# Patient Record
Sex: Female | Born: 1965 | Race: White | Hispanic: No | Marital: Married | Smoking: Never smoker
Health system: Southern US, Community
[De-identification: ages and names within clinical notes are randomized; demographics above are authoritative.]

## PROBLEM LIST (undated history)

## (undated) DIAGNOSIS — K219 Gastro-esophageal reflux disease without esophagitis: Secondary | ICD-10-CM

## (undated) DIAGNOSIS — G43909 Migraine, unspecified, not intractable, without status migrainosus: Secondary | ICD-10-CM

## (undated) DIAGNOSIS — F419 Anxiety disorder, unspecified: Secondary | ICD-10-CM

## (undated) HISTORY — DX: Migraine, unspecified, not intractable, without status migrainosus: G43.909

## (undated) HISTORY — PX: OTHER SURGICAL HISTORY: SHX169

## (undated) HISTORY — DX: Anxiety disorder, unspecified: F41.9

## (undated) HISTORY — DX: Gastro-esophageal reflux disease without esophagitis: K21.9

---

## 2015-05-09 DIAGNOSIS — G43919 Migraine, unspecified, intractable, without status migrainosus: Secondary | ICD-10-CM | POA: Diagnosis not present

## 2015-05-09 DIAGNOSIS — F410 Panic disorder [episodic paroxysmal anxiety] without agoraphobia: Secondary | ICD-10-CM | POA: Diagnosis not present

## 2015-07-11 DIAGNOSIS — G44009 Cluster headache syndrome, unspecified, not intractable: Secondary | ICD-10-CM | POA: Diagnosis not present

## 2015-10-11 DIAGNOSIS — Z23 Encounter for immunization: Secondary | ICD-10-CM | POA: Diagnosis not present

## 2015-11-11 DIAGNOSIS — Z01419 Encounter for gynecological examination (general) (routine) without abnormal findings: Secondary | ICD-10-CM | POA: Diagnosis not present

## 2015-11-11 DIAGNOSIS — Z6825 Body mass index (BMI) 25.0-25.9, adult: Secondary | ICD-10-CM | POA: Diagnosis not present

## 2015-11-11 DIAGNOSIS — Z124 Encounter for screening for malignant neoplasm of cervix: Secondary | ICD-10-CM | POA: Diagnosis not present

## 2015-12-04 ENCOUNTER — Encounter: Payer: Self-pay | Admitting: Pediatrics

## 2015-12-04 ENCOUNTER — Ambulatory Visit (INDEPENDENT_AMBULATORY_CARE_PROVIDER_SITE_OTHER): Payer: BLUE CROSS/BLUE SHIELD | Admitting: Pediatrics

## 2015-12-04 VITALS — BP 124/83 | HR 79 | Temp 97.5°F | Ht 66.0 in | Wt 161.2 lb

## 2015-12-04 DIAGNOSIS — G43009 Migraine without aura, not intractable, without status migrainosus: Secondary | ICD-10-CM | POA: Diagnosis not present

## 2015-12-04 DIAGNOSIS — M25512 Pain in left shoulder: Secondary | ICD-10-CM

## 2015-12-04 DIAGNOSIS — Z1231 Encounter for screening mammogram for malignant neoplasm of breast: Secondary | ICD-10-CM | POA: Diagnosis not present

## 2015-12-04 DIAGNOSIS — M25511 Pain in right shoulder: Secondary | ICD-10-CM | POA: Diagnosis not present

## 2015-12-04 DIAGNOSIS — Z1239 Encounter for other screening for malignant neoplasm of breast: Secondary | ICD-10-CM

## 2015-12-04 DIAGNOSIS — Z1211 Encounter for screening for malignant neoplasm of colon: Secondary | ICD-10-CM

## 2015-12-04 NOTE — Progress Notes (Signed)
Subjective:   Patient ID: Amy Stout, female    DOB: 05-Oct-1965, 50 y.o.   MRN: [id number] CC: New Patient (Initial Visit) follow up med problems  HPI: Amy Stout is a 50 y.o. female presenting for New Patient (Initial Visit)  Headaches:  At most 1-2 times a week This month has had rarely Tylenol, BC powder doesn't help Headaches dont wake her up, does often start in the morning Usually starts piercing pain medial R eyebrow Missed test for sleep apnea Headaches last for a few hours usually Recently started on sumatriptan, does help Sometimes helps when she presses on the spot that hurts Has astigmatism R eye, had laser surgery to correct near vision both eyes, not distance vision R eye Has had headaches off and on for most of her life No light or sound sensitivity Does feel nauseous at times with headaches Sometimes says she "just needs to lie down" with headache Sumatriptan does not make her sleepy  Pap smear within last month: at [facility]  Has had shoulder pain b/l off and on Had cortisone shot two years ago, helped some Works in [employer], repetitive motion   Depression screen PHQ 2/9 12/04/2015  Decreased Interest 0  Down, Depressed, Hopeless 0  PHQ - 2 Score 0   Past Medical History:  Diagnosis Date  . Migraines    Family History  Problem Relation Age of Onset  . Arthritis Mother   . Depression Mother   . Hearing loss Mother   . Miscarriages / [place] Mother   . Alcohol abuse Father   . Cancer Father   . Diabetes Father   . Heart disease Father   . Asthma Maternal Aunt   . Asthma Maternal Uncle   . Stroke Paternal Grandfather    Social History   Social History  . Marital status: Married    Spouse name: N/A  . Number of children: N/A  . Years of education: N/A   Social History Main Topics  . Smoking status: Never Smoker  . Smokeless tobacco: Never Used  . Alcohol use No  . Drug use: No  . Sexual activity: Not Asked   Other  Topics Concern  . None   Social History Narrative  . None   ROS: All systems negative other than what is in HPI  Objective:    BP 124/83   Pulse 79   Temp 97.5 F (36.4 C) (Oral)   Ht 5\' 6"  (1.676 m)   Wt 161 lb 3.2 oz (73.1 kg)   BMI 26.02 kg/m   Wt Readings from Last 3 Encounters:  12/04/15 161 lb 3.2 oz (73.1 kg)    Gen: NAD, alert, cooperative with exam, NCAT EYES: EOMI, no conjunctival injection, or no icterus ENT:  TMs pearly gray b/l, OP without erythema LYMPH: no cervical LAD CV: NRRR, normal S1/S2, no murmur, distal pulses 2+ b/l Resp: CTABL, no wheezes, normal WOB Abd: +BS, soft, NTND. no guarding or organomegaly Ext: No edema, warm Neuro: Alert and oriented, strength equal b/l UE and LE, coordination grossly normal MSK: normal ROM b/l shoulder, rotator cuff muscles intact, normal to inspection and palpation, no tenderness over biceps insertion b/l  Assessment & Plan:  Aurella was seen today for new patient (initial visit), f/u multiple med problems.  Diagnoses and all orders for this visit:  Migraine without aura and without status migrainosus, not intractable Well controlled Cont sumatriptan prn  Colon cancer screening Does not want colonoscopy at this time,  agrees to below -     Fecal occult blood, imunochemical; Future  Breast cancer screening Due for mammogram -     MM Digital Screening; Future  Pain of both shoulder joints Comes and goes Pain improved now compared with worst pain Uses shoulders daily at work and work around house Nl ROM, NSAIDs prn, rest, ice Let me know if pain worsens  Follow up plan: Return in about 6 months (around 06/02/2016). [name], MD [place]

## 2015-12-05 ENCOUNTER — Other Ambulatory Visit: Payer: BLUE CROSS/BLUE SHIELD

## 2015-12-05 DIAGNOSIS — Z1211 Encounter for screening for malignant neoplasm of colon: Secondary | ICD-10-CM | POA: Diagnosis not present

## 2015-12-06 LAB — FECAL OCCULT BLOOD, IMMUNOCHEMICAL: Fecal Occult Bld: NEGATIVE

## 2016-05-04 ENCOUNTER — Encounter: Payer: BLUE CROSS/BLUE SHIELD | Admitting: *Deleted

## 2016-05-04 DIAGNOSIS — Z1231 Encounter for screening mammogram for malignant neoplasm of breast: Secondary | ICD-10-CM | POA: Diagnosis not present

## 2016-05-06 DIAGNOSIS — L821 Other seborrheic keratosis: Secondary | ICD-10-CM | POA: Diagnosis not present

## 2016-05-06 DIAGNOSIS — B078 Other viral warts: Secondary | ICD-10-CM | POA: Diagnosis not present

## 2016-09-23 DIAGNOSIS — Z23 Encounter for immunization: Secondary | ICD-10-CM | POA: Diagnosis not present

## 2017-03-10 ENCOUNTER — Ambulatory Visit: Payer: BLUE CROSS/BLUE SHIELD | Admitting: Pediatrics

## 2017-03-10 ENCOUNTER — Encounter: Payer: Self-pay | Admitting: Pediatrics

## 2017-03-10 VITALS — BP 122/85 | HR 81 | Temp 97.4°F | Ht 66.0 in | Wt 168.0 lb

## 2017-03-10 DIAGNOSIS — F411 Generalized anxiety disorder: Secondary | ICD-10-CM | POA: Diagnosis not present

## 2017-03-10 DIAGNOSIS — G43009 Migraine without aura, not intractable, without status migrainosus: Secondary | ICD-10-CM | POA: Diagnosis not present

## 2017-03-10 MED ORDER — SUMATRIPTAN SUCCINATE 50 MG PO TABS: 50.0000 mg | ORAL_TABLET | ORAL | 4 refills | Status: DC | PRN

## 2017-03-10 MED ORDER — VENLAFAXINE HCL 25 MG PO TABS: 25.0000 mg | ORAL_TABLET | Freq: Two times a day (BID) | ORAL | 2 refills | Status: DC

## 2017-03-10 NOTE — Progress Notes (Signed)
Subjective:   Patient ID: Amy Stout, female    DOB: 08/17/1965, 52 y.o.   MRN: [id number] CC: Medication Refill (Imitrex)  HPI: Amy Stout is a 52 y.o. female presenting for Medication Refill (Imitrex)  Migraines: sometimes gets HA in the morning, sometimes at night. Doesn't wake her up, but HA sometimes there in the morning. Sometimes has 5 HA in a month, sometimes maybe 1. Always has the same headache, hurts R eyebrow, same HA no matter what time of day. Sometimes with nausea, sometimes light and sound bother her. Sometimes tries ibuprofen/tylenol for HA, helps with more minor HA. Bad HA lays down, sleeps some, takes imitrex with improvement. Headaches havent changed.  Here today for refill of her sumatriptan.  Some months rarely needing.  Some months she uses several times.  She does think that the headaches are affecting her often enough she would be interested in prophylactic medication.  Sleeps restlessly, says she always has. Snores at times. Not falling asleep easily during the day.  Patient says she was recommended in the past for her to have a sleep study done, patient says she does not want to have done.  She feels anxious much of the time.  Especially at work, sometimes is more irritable.  Mood has been fine.  No thoughts of self-harm.  Mother had significant anxiety patient thinks, was never treated.  GAD 7 : Generalized Anxiety Score 03/10/2017  Nervous, Anxious, on Edge 3  Control/stop worrying 3  Worry too much - different things 3  Trouble relaxing 3  Restless 0  Easily annoyed or irritable 2  Afraid - awful might happen 3  Total GAD 7 Score 17  Anxiety Difficulty Somewhat difficult    Relevant past medical, surgical, family and social history reviewed. Allergies and medications reviewed and updated. Social History   Tobacco Use  Smoking Status Never Smoker  Smokeless Tobacco Never Used   ROS: Per HPI   Objective:    BP 122/85   Pulse 81   Temp (!) 97.4  F (36.3 C) (Oral)   Ht 5\' 6"  (1.676 m)   Wt 168 lb (76.2 kg)   BMI 27.12 kg/m   Wt Readings from Last 3 Encounters:  03/10/17 168 lb (76.2 kg)  12/04/15 161 lb 3.2 oz (73.1 kg)    Gen: NAD, alert, cooperative with exam, NCAT EYES: EOMI, no conjunctival injection, or no icterus CV: NRRR, normal S1/S2, no murmur, distal pulses 2+ b/l Resp: CTABL, no wheezes, normal WOB Abd: +BS, soft, NTND.  Ext: No edema, warm Neuro: Alert and oriented, strength equal b/l UE and LE, coordination grossly normal MSK: normal muscle bulk Psych: normal affect, no thoughts of self-harm  Assessment & Plan:  [name] was seen today for medication refill.  Diagnoses and all orders for this visit:  Migraine without aura and without status migrainosus, not intractable Will start venlafaxine as prophylactic medicine, also may help treat anxiety.  Take half tab twice a day for several days, then take full tab twice a day. -     SUMAtriptan (IMITREX) 50 MG tablet; Take 1 tablet (50 mg total) by mouth every 2 (two) hours as needed for migraine. May repeat in 2 hours if headache persists or recurs. -     venlafaxine (EFFEXOR) 25 MG tablet; Take 1 tablet (25 mg total) by mouth 2 (two) times daily.  GAD (generalized anxiety disorder) Ongoing symptoms.  Will start below. -     venlafaxine (EFFEXOR) 25 MG tablet;  Take 1 tablet (25 mg total) by mouth 2 (two) times daily.   Follow up plan: Return in about 6 weeks (around 04/21/2017). [name], MD [place]

## 2017-03-20 ENCOUNTER — Telehealth: Payer: Self-pay | Admitting: Pediatrics

## 2017-03-31 NOTE — Telephone Encounter (Signed)
Attempted to contact patient - NVM. Patient has not returned calls. This encounter will be closed.

## 2017-04-21 ENCOUNTER — Ambulatory Visit: Payer: BLUE CROSS/BLUE SHIELD | Admitting: Pediatrics

## 2017-04-21 ENCOUNTER — Encounter: Payer: Self-pay | Admitting: Pediatrics

## 2017-04-21 VITALS — BP 130/84 | HR 76 | Temp 98.1°F | Ht 66.0 in | Wt 168.2 lb

## 2017-04-21 DIAGNOSIS — G43809 Other migraine, not intractable, without status migrainosus: Secondary | ICD-10-CM

## 2017-04-21 DIAGNOSIS — G43009 Migraine without aura, not intractable, without status migrainosus: Secondary | ICD-10-CM

## 2017-04-21 DIAGNOSIS — J3089 Other allergic rhinitis: Secondary | ICD-10-CM | POA: Diagnosis not present

## 2017-04-21 MED ORDER — AMITRIPTYLINE HCL 10 MG PO TABS: 10.0000 mg | ORAL_TABLET | Freq: Every day | ORAL | 3 refills | Status: DC

## 2017-04-21 MED ORDER — SUMATRIPTAN SUCCINATE 50 MG PO TABS: 50.0000 mg | ORAL_TABLET | ORAL | 4 refills | Status: DC | PRN

## 2017-04-21 MED ORDER — FLUTICASONE PROPIONATE 50 MCG/ACT NA SUSP: 2.0000 | Freq: Every day | NASAL | 6 refills | Status: DC

## 2017-04-21 NOTE — Progress Notes (Signed)
  Subjective:   Patient ID: Amy Stout, female    DOB: 29-Sep-1965, 52 y.o.   MRN: [id number] CC: Follow-up (6 week recheck, migraines and anxiety)  HPI: [name] is a 52 y.o. female presenting for Follow-up (6 week recheck, migraines and anxiety)  Had daily headaches until about two weeks ago. Now happening as much since then. Last few days hasnt had much problem with her headaches.   Was started on venlafaxine last visit, had nausea, started feeling fidgety. Took for about three days, half a tab, before symptoms became too severe to continue.  Has a full refill of sumatriptan still at home.  Has been sneezing daily, taking claritin-D for the past week, helping some.  Ongoing congestion most of the time.  For the last few weeks.  Relevant past medical, surgical, family and social history reviewed. Allergies and medications reviewed and updated. Social History   Tobacco Use  Smoking Status Never Smoker  Smokeless Tobacco Never Used   ROS: Per HPI   Objective:    BP 130/84   Pulse 76   Temp 98.1 F (36.7 C) (Oral)   Ht 5\' 6"  (1.676 m)   Wt 168 lb 3.2 oz (76.3 kg)   BMI 27.15 kg/m   Wt Readings from Last 3 Encounters:  04/21/17 168 lb 3.2 oz (76.3 kg)  03/10/17 168 lb (76.2 kg)  12/04/15 161 lb 3.2 oz (73.1 kg)    Gen: NAD, alert, cooperative with exam, NCAT EYES: EOMI, no conjunctival injection, or no icterus ENT:  TMs pearly gray b/l, OP without erythema CV: NRRR, normal S1/S2, no murmur, distal pulses 2+ b/l Resp: CTABL, no wheezes, normal WOB Abd: +BS, soft, NTND. no guarding or organomegaly Ext: No edema, warm Neuro: Alert and oriented, strength equal b/l UE and LE, coordination grossly normal MSK: normal muscle bulk  Assessment & Plan:  Adanna was seen today for follow-up migraines.  Diagnoses and all orders for this visit:  Other migraine without status migrainosus, not intractable Ongoing symptoms.  Continue sumatriptan as needed.  No more than  twice a week.  Will do trial below for prophylaxis. -     amitriptyline (ELAVIL) 10 MG tablet; Take 1 tablet (10 mg total) by mouth at bedtime.  Allergic rhinitis due to other allergic trigger, unspecified seasonality Discussed sinus rinses, continue antihistamine. -     fluticasone (FLONASE) 50 MCG/ACT nasal spray; Place 2 sprays into both nostrils daily.  Migraine without aura and without status migrainosus, not intractable -     SUMAtriptan (IMITREX) 50 MG tablet; Take 1 tablet (50 mg total) by mouth every 2 (two) hours as needed for migraine. May repeat in 2 hours if headache persists or recurs.   Follow up plan: Return in about 3 months (around 07/21/2017). [name], MD [place]

## 2017-06-30 DIAGNOSIS — Z30432 Encounter for removal of intrauterine contraceptive device: Secondary | ICD-10-CM | POA: Diagnosis not present

## 2017-07-23 ENCOUNTER — Ambulatory Visit: Payer: BLUE CROSS/BLUE SHIELD | Admitting: Pediatrics

## 2017-07-23 ENCOUNTER — Encounter: Payer: Self-pay | Admitting: Pediatrics

## 2017-07-23 VITALS — BP 118/81 | HR 85 | Temp 98.2°F | Ht 66.0 in | Wt 173.0 lb

## 2017-07-23 DIAGNOSIS — R0683 Snoring: Secondary | ICD-10-CM | POA: Diagnosis not present

## 2017-07-23 DIAGNOSIS — Z6827 Body mass index (BMI) 27.0-27.9, adult: Secondary | ICD-10-CM

## 2017-07-23 DIAGNOSIS — Z1211 Encounter for screening for malignant neoplasm of colon: Secondary | ICD-10-CM

## 2017-07-23 DIAGNOSIS — Z13220 Encounter for screening for lipoid disorders: Secondary | ICD-10-CM | POA: Diagnosis not present

## 2017-07-23 DIAGNOSIS — G43009 Migraine without aura, not intractable, without status migrainosus: Secondary | ICD-10-CM | POA: Diagnosis not present

## 2017-07-23 LAB — LIPID PANEL
Chol/HDL Ratio: 3.1 ratio (ref 0.0–4.4)
Cholesterol, Total: 191 mg/dL (ref 100–199)
HDL: 61 mg/dL (ref >39)
LDL Calculated: 112 mg/dL — ABNORMAL HIGH (ref 0–99)
Triglycerides: 89 mg/dL (ref 0–149)
VLDL Cholesterol Cal: 18 mg/dL (ref 5–40)

## 2017-07-23 LAB — BMP8+EGFR
BUN/Creatinine Ratio: 10 (ref 9–23)
BUN: 9 mg/dL (ref 6–24)
CO2: 27 mmol/L (ref 20–29)
Calcium: 9.7 mg/dL (ref 8.7–10.2)
Chloride: 101 mmol/L (ref 96–106)
Creatinine, Ser: 0.89 mg/dL (ref 0.57–1.00)
GFR calc Af Amer: 86 mL/min/{1.73_m2} (ref >59)
GFR calc non Af Amer: 75 mL/min/{1.73_m2} (ref >59)
Glucose: 101 mg/dL — ABNORMAL HIGH (ref 65–99)
Potassium: 3.8 mmol/L (ref 3.5–5.2)
Sodium: 141 mmol/L (ref 134–144)

## 2017-07-23 MED ORDER — SUMATRIPTAN SUCCINATE 50 MG PO TABS: 50.0000 mg | ORAL_TABLET | ORAL | 4 refills | Status: DC | PRN

## 2017-07-23 MED ORDER — AMITRIPTYLINE HCL 10 MG PO TABS: 10.0000 mg | ORAL_TABLET | Freq: Every day | ORAL | 3 refills | Status: DC

## 2017-07-23 NOTE — Progress Notes (Signed)
  Subjective:   Patient ID: Amy Stout, female    DOB: Feb 24, 1965, 52 y.o.   MRN: [id number] CC: Medical Management of Chronic Issues  HPI: Amy Stout is a 52 y.o. female   Migraine: Ongoing symptoms.  Frequency of headaches varies.  She thinks overall improved since starting amitriptyline.  She is rarely having to use the sumatriptan.  Snoring loudly, feeling tired during the day. Sometimes headaches in the morning. Headaches improved since starting amitriptyline. Does not want sleep study, does not want to have to wear CPAP.   Relevant past medical, surgical, family and social history reviewed. Allergies and medications reviewed and updated. Social History   Tobacco Use  Smoking Status Never Smoker  Smokeless Tobacco Never Used   ROS: Per HPI   Objective:    BP 118/81   Pulse 85   Temp 98.2 F (36.8 C) (Oral)   Ht _0  (1.676 m)   Wt 173 lb (78.5 kg)   BMI 27.92 kg/m   Wt Readings from Last 3 Encounters:  07/23/17 173 lb (78.5 kg)  04/21/17 168 lb 3.2 oz (76.3 kg)  03/10/17 168 lb (76.2 kg)    Gen: NAD, alert, cooperative with exam, NCAT EYES: EOMI, no conjunctival injection, or no icterus ENT:  TMs pearly gray b/l, OP without erythema LYMPH: no cervical LAD CV: NRRR, normal S1/S2, no murmur, distal pulses 2+ b/l Resp: CTABL, no wheezes, normal WOB Abd: +BS, soft, NTND. no guarding or organomegaly Ext: No edema, warm Neuro: Alert and oriented, strength equal b/l UE and LE, coordination grossly normal MSK: normal muscle bulk  Assessment & Plan:  Amy Stout was seen today for medical management of chronic issues.  Diagnoses and all orders for this visit:  Migraine without aura and without status migrainosus, not intractable Improved symptoms, continue below.  -     amitriptyline (ELAVIL) 10 MG tablet; Take 1 tablet (10 mg total) by mouth at bedtime. -     SUMAtriptan (IMITREX) 50 MG tablet; Take 1 tablet (50 mg total) by mouth every 2 (two) hours as needed for  migraine. May repeat in 2 hours if headache persists or recurs. -     BMP8+EGFR  Screening for hyperlipidemia -     Lipid panel  Screening for colon cancer -     Fecal occult blood, imunochemical; Future  Snoring Declined sleep study, will continue to follow symptoms, let me know if any worsening. Discussed long term risks of untreated sleep apnea.  BMI 27.0-27.9,adult Continue lifestyle changes, increasing fruit/vegetables  Follow up plan: Return in about 6 months (around 01/23/2018). [name], MD [place]

## 2017-08-05 ENCOUNTER — Telehealth: Payer: Self-pay | Admitting: Pediatrics

## 2017-08-05 NOTE — Telephone Encounter (Signed)
Pt aware of results 

## 2017-09-28 DIAGNOSIS — Z23 Encounter for immunization: Secondary | ICD-10-CM | POA: Diagnosis not present

## 2017-10-06 ENCOUNTER — Encounter: Payer: Self-pay | Admitting: Pediatrics

## 2017-10-06 ENCOUNTER — Ambulatory Visit: Payer: BLUE CROSS/BLUE SHIELD | Admitting: Pediatrics

## 2017-10-06 VITALS — BP 132/82 | HR 83 | Temp 98.1°F | Ht 66.0 in | Wt 173.2 lb

## 2017-10-06 DIAGNOSIS — K219 Gastro-esophageal reflux disease without esophagitis: Secondary | ICD-10-CM

## 2017-10-06 DIAGNOSIS — G43009 Migraine without aura, not intractable, without status migrainosus: Secondary | ICD-10-CM

## 2017-10-06 DIAGNOSIS — M25531 Pain in right wrist: Secondary | ICD-10-CM

## 2017-10-06 MED ORDER — AMITRIPTYLINE HCL 25 MG PO TABS: 25.0000 mg | ORAL_TABLET | Freq: Every day | ORAL | 2 refills | Status: DC

## 2017-10-06 MED ORDER — ESOMEPRAZOLE MAGNESIUM 40 MG PO CPDR: 40.0000 mg | DELAYED_RELEASE_CAPSULE | Freq: Every day | ORAL | 1 refills | Status: DC

## 2017-10-06 NOTE — Progress Notes (Signed)
Subjective:   Patient ID: Amy Stout, female    DOB: [birth year], 52 y.o.   MRN: [id number] CC: Medical Management of Chronic Issues and Gastroesophageal Reflux  HPI: ANNALEIGH Stout is a 51 y.o. female   Migraines: improved with amitriptyline, this months she thinks she has had 10 which is more than usual she thinks. Not sure why this month has been worse. Works in [employer], noisy, wears ear plugs, smells of chemicals. Taking imitrex rarely, < 2x/week. Refilled once in 3 mo and has only taken one tab of refill.  GERD: Takes over-the-counter Nexium 20 mg most days.  She skips it more than a couple days in a row she notices more symptoms.  She is not sure that certain foods seem to have an effect compared to others.  Most recently she noticed symptoms after eating pizza and a salad for lunch.  She stays fairly active, work she is lifting and moving regularly.  She never has chest pressure or chest pain with exertion.  Right wrist radial side has been bothering her over the last 2 weeks.  She notices it mostly with working.  She is been wearing a splint while working.  No injury that she knows of.  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  She does notice the pain more after regularly lifting.  Relevant past medical, surgical, family and social history reviewed. Allergies and medications reviewed and updated. Social History   Tobacco Use  Smoking Status Never Smoker  Smokeless Tobacco Never Used   ROS: Per HPI   Objective:    BP 132/82   Pulse 83   Temp 98.1 F (36.7 C) (Oral)   Ht 5\' 6"  (1.676 m)   Wt 173 lb 3.2 oz (78.6 kg)   BMI 27.96 kg/m   Wt Readings from Last 3 Encounters:  10/06/17 173 lb 3.2 oz (78.6 kg)  07/23/17 173 lb (78.5 kg)  04/21/17 168 lb 3.2 oz (76.3 kg)    Gen: NAD, alert, cooperative with exam, NCAT EYES: EOMI, no conjunctival injection, or no icterus ENT:  OP without erythema LYMPH: no cervical LAD CV: NRRR,  normal S1/S2, no murmur, distal pulses 2+ b/l Resp: CTABL, no wheezes, normal WOB Abd: +BS, soft, NTND. no guarding or organomegaly Ext: No edema, warm Neuro: Alert and oriented, strength equal b/l UE and LE, coordination grossly normal MSK: normal muscle bulk  Assessment & Plan:  Moani was seen today for medical management of chronic issues and gastroesophageal reflux.  Diagnoses and all orders for this visit:  Gastroesophageal reflux disease, esophagitis presence not specified Try increased dose of below for 2 mo, then consider de-escalating if able. Discussed use of prns, avoiding exacerbating foods. -     esomeprazole (NEXIUM) 40 MG capsule; Take 1 capsule (40 mg total) by mouth daily at 12 noon.  Migraine without aura and without status migrainosus, not intractable Somewhat improved. Work still a trigger at times, migraines also keep her from completing work at times. FMLA filled out today, may need to be out for 0-2 days a week 0-2 weeks a month over next 12 mo for chronic med problem, migraines. Working on improving control with below, start increased dose. Also discussed decreasing caffeine intake, drinks large amounts daily now. -     amitriptyline (ELAVIL) 25 MG tablet; Take 1 tablet (25 mg total) by mouth at bedtime.  Right wrist pain No known injury, worse with repetitive movements with wrist.  Cont wrist splint as needed, especially at work. If not improving over next 1-2 weeks will get wrist xray, consider referral to ortho vs PT. Any worsening let me know.  Follow up plan: Return in about 3 months (around 01/06/2018). [name], MD [place]

## 2017-10-06 NOTE — Patient Instructions (Signed)

## 2017-11-23 ENCOUNTER — Telehealth: Payer: Self-pay | Admitting: Pediatrics

## 2017-11-23 DIAGNOSIS — M25531 Pain in right wrist: Secondary | ICD-10-CM

## 2017-11-23 NOTE — Telephone Encounter (Signed)
Please advise if patient came come in to just get xray or needs to be seen again?

## 2017-11-23 NOTE — Telephone Encounter (Signed)
Patient aware and verbalizes understanding. 

## 2017-11-24 ENCOUNTER — Ambulatory Visit (INDEPENDENT_AMBULATORY_CARE_PROVIDER_SITE_OTHER): Payer: BLUE CROSS/BLUE SHIELD

## 2017-11-24 DIAGNOSIS — M25531 Pain in right wrist: Secondary | ICD-10-CM

## 2018-01-24 ENCOUNTER — Ambulatory Visit: Payer: BLUE CROSS/BLUE SHIELD | Admitting: Pediatrics

## 2018-01-28 ENCOUNTER — Ambulatory Visit: Payer: Self-pay | Admitting: Family Medicine

## 2018-01-28 ENCOUNTER — Ambulatory Visit: Payer: BLUE CROSS/BLUE SHIELD | Admitting: Pediatrics

## 2018-08-01 ENCOUNTER — Other Ambulatory Visit: Payer: Self-pay

## 2018-08-02 ENCOUNTER — Telehealth: Payer: Self-pay | Admitting: Family

## 2018-08-02 ENCOUNTER — Encounter: Payer: Self-pay | Admitting: Family

## 2018-08-02 ENCOUNTER — Ambulatory Visit: Payer: BC Managed Care – PPO | Admitting: Family

## 2018-08-02 VITALS — BP 135/89 | HR 89 | Temp 98.6°F | Ht 66.0 in | Wt 178.6 lb

## 2018-08-02 DIAGNOSIS — J3089 Other allergic rhinitis: Secondary | ICD-10-CM

## 2018-08-02 DIAGNOSIS — K219 Gastro-esophageal reflux disease without esophagitis: Secondary | ICD-10-CM | POA: Diagnosis not present

## 2018-08-02 DIAGNOSIS — G43009 Migraine without aura, not intractable, without status migrainosus: Secondary | ICD-10-CM

## 2018-08-02 DIAGNOSIS — E049 Nontoxic goiter, unspecified: Secondary | ICD-10-CM

## 2018-08-02 DIAGNOSIS — E669 Obesity, unspecified: Secondary | ICD-10-CM | POA: Insufficient documentation

## 2018-08-02 DIAGNOSIS — F411 Generalized anxiety disorder: Secondary | ICD-10-CM

## 2018-08-02 DIAGNOSIS — K2100 Gastro-esophageal reflux disease with esophagitis, without bleeding: Secondary | ICD-10-CM

## 2018-08-02 DIAGNOSIS — E663 Overweight: Secondary | ICD-10-CM

## 2018-08-02 MED ORDER — SUMATRIPTAN SUCCINATE 50 MG PO TABS: 50.0000 mg | ORAL_TABLET | ORAL | 4 refills | Status: DC | PRN

## 2018-08-02 MED ORDER — FLUTICASONE PROPIONATE 50 MCG/ACT NA SUSP: 2.0000 | Freq: Every day | NASAL | 6 refills | Status: DC

## 2018-08-02 MED ORDER — AMITRIPTYLINE HCL 25 MG PO TABS: 25.0000 mg | ORAL_TABLET | Freq: Every day | ORAL | 2 refills | Status: DC

## 2018-08-02 MED ORDER — ESOMEPRAZOLE MAGNESIUM 40 MG PO CPDR: 40.0000 mg | DELAYED_RELEASE_CAPSULE | Freq: Every day | ORAL | 2 refills | Status: DC

## 2018-08-02 NOTE — Patient Instructions (Addendum)
Goiter  A goiter is an enlarged thyroid gland. The thyroid is located in the lower front of the neck. It makes hormones that affect many body parts and systems, including the system that affects how quickly the body burns fuel for energy (metabolism). Most goiters are painless and are not a cause for concern. Some goiters can affect the way your thyroid makes thyroid hormones. Goiters and conditions that cause goiters can be treated, if necessary. What are the causes? Common causes of this condition include:  Lack (deficiency) of a mineral called iodine. The thyroid gland uses iodine to make thyroid hormones.  Diseases that attack healthy cells in the body (autoimmune diseases) and affect thyroid function, such as Graves' disease or Hashimoto's disease. These diseases may cause the body to produce too much thyroid hormone (hyperthyroidism) or too little of the hormone (hypothyroidism).  Conditions that cause inflammation of the thyroid (thyroiditis).  One or more small growths on the thyroid (nodular goiter). Other causes include:  Medical problems caused by abnormal genes that are passed from parent to child (genetic defects).  Thyroid injury or infection.  Tumors that may or may not be cancerous.  Pregnancy.  Certain medicines.  Exposure to radiation. In some cases, the cause may not be known. What increases the risk? This condition is more likely to develop in:  People who do not get enough iodine in their diet.  People who have a family history of goiter.  Women.  People who are older than age 40.  People who smoke tobacco.  People who have had exposure to radiation. What are the signs or symptoms? The main symptom of this condition is swelling in the lower, front part of the neck. This swelling can range from a very small bump to a large lump. Other symptoms may include:  A tight feeling in the throat.  A hoarse voice.  Coughing.  Wheezing.  Difficulty  swallowing or breathing.  Bulging veins in the neck.  Dizziness. When a goiter is the result of an overactive thyroid (hyperthyroidism), symptoms may also include:  Nervousness or restlessness.  Inability to tolerate heat.  Unexplained weight loss.  Diarrhea.  Change in the texture of hair or skin.  Changes in heartbeat, such as skipped beats, extra beats, or a rapid heart rate.  Loss of menstruation.  Shaky hands.  Increased appetite.  Sleep problems. When a goiter is the result of an underactive thyroid (hypothyroidism), symptoms may also include:  Feeling like you have no energy (lethargy).  Inability to tolerate cold.  Weight gain that is not explained by a change in diet or exercise habits.  Dry skin.  Coarse hair.  Irregular menstrual periods.  Constipation.  Sadness or depression.  Fatigue. In some cases, there may not be any symptoms and the thyroid hormone levels may be normal. How is this diagnosed? This condition may be diagnosed based on your symptoms, your medical history, and a physical exam. You may have tests, such as:  Blood tests to check thyroid function.  Imaging tests, such as: ? Ultrasound. ? CT scan. ? MRI. ? Thyroid scan.  Removal of a tissue sample (biopsy) of the goiter or any nodules. The sample will be tested to check for cancer. How is this treated? Treatment for this condition depends on the cause and your symptoms. Treatment may include:  Medicines to regulate thyroid hormone levels.  Anti-inflammatory medicines or steroid medicines, if the goiter is caused by inflammation.  Iodine supplements or changes to your   diet, if the goiter is caused by iodine deficiency.  Radioactive iodine treatment.  Surgery to remove your thyroid. In some cases, you may only need regular check-ups with your health care provider to monitor your condition, and you may not need treatment. Follow these instructions at home:  Follow  instructions from your health care provider about any changes to your diet.  Take over-the-counter and prescription medicines only as told by your health care provider. These include supplements.  Do not use any products that contain nicotine or tobacco, such as cigarettes and e-cigarettes. If you need help quitting, ask your health care provider.  Keep all follow-up visits as told by your health care provider. This is important. Contact a health care provider if:  Your symptoms do not get better with treatment.  You have nausea, vomiting, or diarrhea. Get help right away if:  You have sudden, unexplained confusion or other mental changes.  You have a fever.  You have chest pain.  You have trouble breathing or swallowing.  You suddenly become very weak.  You experience extreme restlessness.  You feel your heart racing. Summary  A goiter is an enlarged thyroid gland.  The thyroid gland is located in the lower front of the neck. It makes hormones that affect many body parts and systems, including the system that affects how quickly the body burns fuel for energy (metabolism).  The main symptom of this condition is swelling in the lower, front part of the neck. This swelling can range from a very small bump to a large lump.  Treatment for this condition depends on the cause and your symptoms. You may need medicines, supplements, or regular monitoring of your condition. This information is not intended to replace advice given to you by your health care provider. Make sure you discuss any questions you have with your health care provider. Document Released: 06/11/2009 Document Revised: 12/04/2016 Document Reviewed: 09/17/2016 Elsevier Patient Education  2020 [employer].

## 2018-08-02 NOTE — Progress Notes (Signed)
Subjective:    Patient ID: Amy Stout, female    DOB: April 25, 1965, 53 y.o.   MRN: [id number]  Chief Complaint  Patient presents with  . Medical Management of Chronic Issues   Pt presents to the office today for chronic follow up.  Migraine  This is a chronic problem. The current episode started more than 1 year ago. The problem occurs monthly. The problem has been waxing and waning. The pain is located in the right unilateral region. The pain quality is similar to prior headaches. The quality of the pain is described as aching. The pain is moderate. Associated symptoms include nausea, phonophobia and photophobia. She has tried triptans for the symptoms. The treatment provided moderate relief.  Gastroesophageal Reflux She complains of belching, heartburn and nausea. This is a chronic problem. The current episode started more than 1 year ago. The problem occurs occasionally. The problem has been waxing and waning. She has tried a PPI for the symptoms. The treatment provided moderate relief.      Review of Systems  Eyes: Positive for photophobia.  Gastrointestinal: Positive for heartburn and nausea.  All other systems reviewed and are negative.      Objective:   Physical Exam Vitals signs reviewed.  Constitutional:      General: She is not in acute distress.    Appearance: She is well-developed.  HENT:     Head: Normocephalic and atraumatic.     Comments: Goiter     Right Ear: Tympanic membrane normal.     Left Ear: Tympanic membrane normal.  Eyes:     Pupils: Pupils are equal, round, and reactive to light.  Neck:     Musculoskeletal: Normal range of motion and neck supple.     Thyroid: No thyromegaly.  Cardiovascular:     Rate and Rhythm: Normal rate and regular rhythm.     Heart sounds: Normal heart sounds. No murmur.  Pulmonary:     Effort: Pulmonary effort is normal. No respiratory distress.     Breath sounds: Normal breath sounds. No wheezing.  Abdominal:   General: Bowel sounds are normal. There is no distension.     Palpations: Abdomen is soft.     Tenderness: There is no abdominal tenderness.  Musculoskeletal: Normal range of motion.        General: No tenderness.  Skin:    General: Skin is warm and dry.  Neurological:     Mental Status: She is alert and oriented to person, place, and time.     Cranial Nerves: No cranial nerve deficit.     Deep Tendon Reflexes: Reflexes are normal and symmetric.  Psychiatric:        Behavior: Behavior normal.        Thought Content: Thought content normal.        Judgment: Judgment normal.       BP (!) 142/103   Pulse 94   Temp 98.6 F (37 C) (Oral)   Ht '5\' 6"'  (1.676 m)   Wt 178 lb 9.6 oz (81 kg)   BMI 28.83 kg/m      Assessment & Plan:  Amy Stout comes in today with chief complaint of Medical Management of Chronic Issues   Diagnosis and orders addressed:  1. Migraine without aura and without status migrainosus, not intractable - amitriptyline (ELAVIL) 25 MG tablet; Take 1 tablet (25 mg total) by mouth at bedtime.  Dispense: 90 tablet; Refill: 2 - SUMAtriptan (IMITREX) 50 MG tablet; Take 1  tablet (50 mg total) by mouth every 2 (two) hours as needed for migraine. May repeat in 2 hours if headache persists or recurs.  Dispense: 10 tablet; Refill: 4 - CMP14+EGFR - CBC with Differential/Platelet  2. Gastroesophageal reflux disease, esophagitis presence not specified - esomeprazole (NEXIUM) 40 MG capsule; Take 1 capsule (40 mg total) by mouth daily at 12 noon.  Dispense: 90 capsule; Refill: 2 - CMP14+EGFR - CBC with Differential/Platelet  3. Allergic rhinitis due to other allergic trigger, unspecified seasonality - fluticasone (FLONASE) 50 MCG/ACT nasal spray; Place 2 sprays into both nostrils daily.  Dispense: 16 g; Refill: 6 - CMP14+EGFR - CBC with Differential/Platelet  4. Gastroesophageal reflux disease with esophagitis - CMP14+EGFR - CBC with Differential/Platelet  5.  Overweight (BMI 25.0-29.9) - CMP14+EGFR - CBC with Differential/Platelet  6. Goiter - CMP14+EGFR - CBC with Differential/Platelet - TSH - US Soft Tissue Head/Neck; Future  7. GAD (generalized anxiety disorder)   Labs pending Health Maintenance reviewed- Pt losing insurance 08/19/18, will try to get mammogram scheduled  Diet and exercise encouraged  Follow up plan: 6 months    Evelina Dun, FNP

## 2018-08-03 LAB — CBC WITH DIFFERENTIAL/PLATELET
Basophils Absolute: 0 10*3/uL (ref 0.0–0.2)
Basos: 1 %
EOS (ABSOLUTE): 0 10*3/uL (ref 0.0–0.4)
Eos: 1 %
Hematocrit: 42.1 % (ref 34.0–46.6)
Hemoglobin: 14.5 g/dL (ref 11.1–15.9)
Immature Grans (Abs): 0 10*3/uL (ref 0.0–0.1)
Immature Granulocytes: 0 %
Lymphocytes Absolute: 1.8 10*3/uL (ref 0.7–3.1)
Lymphs: 28 %
MCH: 29.5 pg (ref 26.6–33.0)
MCHC: 34.4 g/dL (ref 31.5–35.7)
MCV: 86 fL (ref 79–97)
Monocytes Absolute: 0.4 10*3/uL (ref 0.1–0.9)
Monocytes: 7 %
Neutrophils Absolute: 4 10*3/uL (ref 1.4–7.0)
Neutrophils: 63 %
Platelets: 242 10*3/uL (ref 150–450)
RBC: 4.91 x10E6/uL (ref 3.77–5.28)
RDW: 12.9 % (ref 11.7–15.4)
WBC: 6.3 10*3/uL (ref 3.4–10.8)

## 2018-08-03 LAB — CMP14+EGFR
ALT: 33 IU/L — ABNORMAL HIGH (ref 0–32)
AST: 16 IU/L (ref 0–40)
Albumin/Globulin Ratio: 1.6 (ref 1.2–2.2)
Albumin: 4.9 g/dL (ref 3.8–4.9)
Alkaline Phosphatase: 110 IU/L (ref 39–117)
BUN/Creatinine Ratio: 14 (ref 9–23)
BUN: 10 mg/dL (ref 6–24)
Bilirubin Total: 0.3 mg/dL (ref 0.0–1.2)
CO2: 26 mmol/L (ref 20–29)
Calcium: 10.1 mg/dL (ref 8.7–10.2)
Chloride: 98 mmol/L (ref 96–106)
Creatinine, Ser: 0.73 mg/dL (ref 0.57–1.00)
GFR calc Af Amer: 109 mL/min/{1.73_m2} (ref >59)
GFR calc non Af Amer: 94 mL/min/{1.73_m2} (ref >59)
Globulin, Total: 3 g/dL (ref 1.5–4.5)
Glucose: 85 mg/dL (ref 65–99)
Potassium: 4.4 mmol/L (ref 3.5–5.2)
Sodium: 141 mmol/L (ref 134–144)
Total Protein: 7.9 g/dL (ref 6.0–8.5)

## 2018-08-03 LAB — TSH: TSH: 1.81 u[IU]/mL (ref 0.450–4.500)

## 2018-08-03 NOTE — Telephone Encounter (Signed)
Did you all try and call patient? Please advise

## 2018-08-03 NOTE — Telephone Encounter (Signed)
I have not called her, it could have been about lab work or scheduling her [place]

## 2018-08-04 NOTE — Telephone Encounter (Signed)
Patient aware.

## 2018-08-04 NOTE — Telephone Encounter (Signed)
Left message for patient to call back.  Patient has an [place] appointment tomorrow 7/31 at 10:30 [facility].  Patient will need to arrive 15 minutes early.

## 2018-08-05 ENCOUNTER — Other Ambulatory Visit: Payer: Self-pay

## 2018-08-05 ENCOUNTER — Ambulatory Visit (HOSPITAL_COMMUNITY)
Admission: RE | Admit: 2018-08-05 | Discharge: 2018-08-05 | Disposition: A | Discharge status: Home / Self Care | Payer: BC Managed Care – PPO | Source: Ambulatory Visit | Attending: Family | Admitting: Family

## 2018-08-05 DIAGNOSIS — E049 Nontoxic goiter, unspecified: Secondary | ICD-10-CM | POA: Diagnosis not present

## 2018-08-16 DIAGNOSIS — Z139 Encounter for screening, unspecified: Secondary | ICD-10-CM | POA: Diagnosis not present

## 2018-08-16 DIAGNOSIS — Z1231 Encounter for screening mammogram for malignant neoplasm of breast: Secondary | ICD-10-CM | POA: Diagnosis not present

## 2019-05-03 ENCOUNTER — Other Ambulatory Visit: Payer: Self-pay | Admitting: Family

## 2019-05-03 DIAGNOSIS — K219 Gastro-esophageal reflux disease without esophagitis: Secondary | ICD-10-CM

## 2019-05-21 DIAGNOSIS — R430 Anosmia: Secondary | ICD-10-CM | POA: Diagnosis not present

## 2019-05-21 DIAGNOSIS — Z20822 Contact with and (suspected) exposure to covid-19: Secondary | ICD-10-CM | POA: Diagnosis not present

## 2019-05-21 DIAGNOSIS — U071 COVID-19: Secondary | ICD-10-CM | POA: Diagnosis not present

## 2019-11-21 ENCOUNTER — Ambulatory Visit (INDEPENDENT_AMBULATORY_CARE_PROVIDER_SITE_OTHER): Payer: BC Managed Care – PPO | Admitting: Family

## 2019-11-21 ENCOUNTER — Encounter: Payer: Self-pay | Admitting: Family

## 2019-11-21 ENCOUNTER — Other Ambulatory Visit: Payer: Self-pay

## 2019-11-21 VITALS — BP 134/88 | HR 79 | Temp 98.1°F | Ht 66.0 in | Wt 179.0 lb

## 2019-11-21 DIAGNOSIS — Z114 Encounter for screening for human immunodeficiency virus [HIV]: Secondary | ICD-10-CM

## 2019-11-21 DIAGNOSIS — F411 Generalized anxiety disorder: Secondary | ICD-10-CM

## 2019-11-21 DIAGNOSIS — G43009 Migraine without aura, not intractable, without status migrainosus: Secondary | ICD-10-CM | POA: Diagnosis not present

## 2019-11-21 DIAGNOSIS — Z0001 Encounter for general adult medical examination with abnormal findings: Secondary | ICD-10-CM

## 2019-11-21 DIAGNOSIS — E663 Overweight: Secondary | ICD-10-CM

## 2019-11-21 DIAGNOSIS — K2100 Gastro-esophageal reflux disease with esophagitis, without bleeding: Secondary | ICD-10-CM

## 2019-11-21 DIAGNOSIS — Z1159 Encounter for screening for other viral diseases: Secondary | ICD-10-CM

## 2019-11-21 DIAGNOSIS — Z0000 Encounter for general adult medical examination without abnormal findings: Secondary | ICD-10-CM | POA: Diagnosis not present

## 2019-11-21 DIAGNOSIS — Z1211 Encounter for screening for malignant neoplasm of colon: Secondary | ICD-10-CM

## 2019-11-21 MED ORDER — SUMATRIPTAN SUCCINATE 50 MG PO TABS: 50.0000 mg | ORAL_TABLET | ORAL | 4 refills | Status: DC | PRN

## 2019-11-21 MED ORDER — AMITRIPTYLINE HCL 25 MG PO TABS: 25.0000 mg | ORAL_TABLET | Freq: Every day | ORAL | 2 refills | Status: DC

## 2019-11-21 MED ORDER — ESOMEPRAZOLE MAGNESIUM 40 MG PO CPDR: 40.0000 mg | DELAYED_RELEASE_CAPSULE | Freq: Every day | ORAL | 3 refills | Status: DC

## 2019-11-21 NOTE — Progress Notes (Signed)
Subjective:    Patient ID: Amy Stout, female    DOB: Sep 03, 1965, 54 y.o.   MRN: [id number]  Chief Complaint  Patient presents with  . Medical Management of Chronic Issues   Pt presents to the office today for CPE without pap.  She reports her anxiety has been increased recently because she thought she was going to have to do jury duty.  Anxiety Presents for follow-up visit. Symptoms include depressed mood, excessive worry, irritability, nausea, nervous/anxious behavior and restlessness. Symptoms occur most days. The severity of symptoms is moderate. The quality of sleep is good.    Gastroesophageal Reflux She complains of belching, heartburn and nausea. This is a chronic problem. The current episode started more than 1 year ago. The problem occurs occasionally. The problem has been waxing and waning. She has tried a PPI for the symptoms. The treatment provided moderate relief.  Migraine  This is a chronic problem. The current episode started more than 1 year ago. The problem occurs intermittently (every other month). The pain is located in the right unilateral region. The pain quality is similar to prior headaches. Associated symptoms include nausea, phonophobia and photophobia.      Review of Systems  Constitutional: Positive for irritability.  Eyes: Positive for photophobia.  Gastrointestinal: Positive for heartburn and nausea.  Psychiatric/Behavioral: The patient is nervous/anxious.   All other systems reviewed and are negative.   Family History  Problem Relation Age of Onset  . Arthritis Mother   . Depression Mother   . Hearing loss Mother   . Miscarriages / [place] Mother   . Alcohol abuse Father   . Cancer Father   . Diabetes Father   . Heart disease Father   . Asthma Maternal Aunt   . Asthma Maternal Uncle   . Stroke Paternal Grandfather    Social History   Socioeconomic History  . Marital status: Married    Spouse name: Not on file  . Number of  children: Not on file  . Years of education: Not on file  . Highest education level: Not on file  Occupational History  . Not on file  Tobacco Use  . Smoking status: Never Smoker  . Smokeless tobacco: Never Used  Substance and Sexual Activity  . Alcohol use: No  . Drug use: No  . Sexual activity: Not on file  Other Topics Concern  . Not on file  Social History Narrative  . Not on file   Social Determinants of Health   Financial Resource Strain:   . Difficulty of Paying Living Expenses: Not on file  Food Insecurity:   . Worried About Charity fundraiser in the Last Year: Not on file  . Ran Out of Food in the Last Year: Not on file  Transportation Needs:   . Lack of Transportation (Medical): Not on file  . Lack of Transportation (Non-Medical): Not on file  Physical Activity:   . Days of Exercise per Week: Not on file  . Minutes of Exercise per Session: Not on file  Stress:   . Feeling of Stress : Not on file  Social Connections:   . Frequency of Communication with Friends and Family: Not on file  . Frequency of Social Gatherings with Friends and Family: Not on file  . Attends Religious Services: Not on file  . Active Member of Clubs or Organizations: Not on file  . Attends Archivist Meetings: Not on file  . Marital Status: Not on file  Objective:   Physical Exam Vitals reviewed.  Constitutional:      General: She is not in acute distress.    Appearance: She is well-developed.  HENT:     Head: Normocephalic and atraumatic.     Right Ear: Tympanic membrane normal.     Left Ear: Tympanic membrane normal.  Eyes:     Pupils: Pupils are equal, round, and reactive to light.  Neck:     Thyroid: No thyromegaly.  Cardiovascular:     Rate and Rhythm: Normal rate and regular rhythm.     Heart sounds: Normal heart sounds. No murmur heard.   Pulmonary:     Effort: Pulmonary effort is normal. No respiratory distress.     Breath sounds: Normal breath  sounds. No wheezing.  Abdominal:     General: Bowel sounds are normal. There is no distension.     Palpations: Abdomen is soft.     Tenderness: There is no abdominal tenderness.  Musculoskeletal:        General: No tenderness. Normal range of motion.     Cervical back: Normal range of motion and neck supple.  Skin:    General: Skin is warm and dry.  Neurological:     Mental Status: She is alert and oriented to person, place, and time.     Cranial Nerves: No cranial nerve deficit.     Deep Tendon Reflexes: Reflexes are normal and symmetric.  Psychiatric:        Behavior: Behavior normal.        Thought Content: Thought content normal.        Judgment: Judgment normal.       BP 134/88   Pulse 79   Temp 98.1 F (36.7 C) (Temporal)   Ht _0  (1.676 m)   Wt 179 lb (81.2 kg)   SpO2 99%   BMI 28.89 kg/m      Assessment & Plan:  NILAH BELCOURT comes in today with chief complaint of Medical Management of Chronic Issues   Diagnosis and orders addressed:  1. Migraine without aura and without status migrainosus, not intractable - SUMAtriptan (IMITREX) 50 MG tablet; Take 1 tablet (50 mg total) by mouth every 2 (two) hours as needed for migraine. May repeat in 2 hours if headache persists or recurs.  Dispense: 10 tablet; Refill: 4 - amitriptyline (ELAVIL) 25 MG tablet; Take 1 tablet (25 mg total) by mouth at bedtime.  Dispense: 90 tablet; Refill: 2 - CMP14+EGFR - CBC with Differential/Platelet  2. Gastroesophageal reflux disease with esophagitis, unspecified whether hemorrhage - esomeprazole (NEXIUM) 40 MG capsule; Take 1 capsule (40 mg total) by mouth daily at 12 noon. (Needs to be seen before next refill)  Dispense: 90 capsule; Refill: 3 - CMP14+EGFR - CBC with Differential/Platelet  3. GAD (generalized anxiety disorder) - CMP14+EGFR - CBC with Differential/Platelet  4. Overweight (BMI 25.0-29.9) - CMP14+EGFR - CBC with Differential/Platelet  5. Annual physical exam -  HIV Antibody (routine testing w rflx) - Hepatitis C antibody - CMP14+EGFR - CBC with Differential/Platelet - Lipid panel - TSH - Cologuard  6. Encounter for screening for human immunodeficiency virus (HIV) - HIV Antibody (routine testing w rflx) - CMP14+EGFR - CBC with Differential/Platelet  7. Need for hepatitis C screening test - Hepatitis C antibody - CMP14+EGFR - CBC with Differential/Platelet  8. Colon cancer screening - CMP14+EGFR - CBC with Differential/Platelet - Cologuard   Labs pending Health Maintenance reviewed Diet and exercise encouraged  Follow up plan: 1  year    Evelina Dun, FNP

## 2019-11-21 NOTE — Patient Instructions (Signed)
Health Maintenance, Female Adopting a healthy lifestyle and getting preventive care are important in promoting health and wellness. Ask your health care provider about:  The right schedule for you to have regular tests and exams.  Things you can do on your own to prevent diseases and keep yourself healthy. What should I know about diet, weight, and exercise? Eat a healthy diet   Eat a diet that includes plenty of vegetables, fruits, low-fat dairy products, and lean protein.  Do not eat a lot of foods that are high in solid fats, added sugars, or sodium. Maintain a healthy weight Body mass index (BMI) is used to identify weight problems. It estimates body fat based on height and weight. Your health care provider can help determine your BMI and help you achieve or maintain a healthy weight. Get regular exercise Get regular exercise. This is one of the most important things you can do for your health. Most adults should:  Exercise for at least 150 minutes each week. The exercise should increase your heart rate and make you sweat (moderate-intensity exercise).  Do strengthening exercises at least twice a week. This is in addition to the moderate-intensity exercise.  Spend less time sitting. Even light physical activity can be beneficial. Watch cholesterol and blood lipids Have your blood tested for lipids and cholesterol at 55 years of age, then have this test every 5 years. Have your cholesterol levels checked more often if:  Your lipid or cholesterol levels are high.  You are older than 55 years of age.  You are at high risk for heart disease. What should I know about cancer screening? Depending on your health history and family history, you may need to have cancer screening at various ages. This may include screening for:  Breast cancer.  Cervical cancer.  Colorectal cancer.  Skin cancer.  Lung cancer. What should I know about heart disease, diabetes, and high blood  pressure? Blood pressure and heart disease  High blood pressure causes heart disease and increases the risk of stroke. This is more likely to develop in people who have high blood pressure readings, are of African descent, or are overweight.  Have your blood pressure checked: ? Every 3-5 years if you are 18-39 years of age. ? Every year if you are 40 years old or older. Diabetes Have regular diabetes screenings. This checks your fasting blood sugar level. Have the screening done:  Once every three years after age 40 if you are at a normal weight and have a low risk for diabetes.  More often and at a younger age if you are overweight or have a high risk for diabetes. What should I know about preventing infection? Hepatitis B If you have a higher risk for hepatitis B, you should be screened for this virus. Talk with your health care provider to find out if you are at risk for hepatitis B infection. Hepatitis C Testing is recommended for:  Everyone born from 1945 through 1965.  Anyone with known risk factors for hepatitis C. Sexually transmitted infections (STIs)  Get screened for STIs, including gonorrhea and chlamydia, if: ? You are sexually active and are younger than 55 years of age. ? You are older than 55 years of age and your health care provider tells you that you are at risk for this type of infection. ? Your sexual activity has changed since you were last screened, and you are at increased risk for chlamydia or gonorrhea. Ask your health care provider if   you are at risk.  Ask your health care provider about whether you are at high risk for HIV. Your health care provider may recommend a prescription medicine to help prevent HIV infection. If you choose to take medicine to prevent HIV, you should first get tested for HIV. You should then be tested every 3 months for as long as you are taking the medicine. Pregnancy  If you are about to stop having your period (premenopausal) and  you may become pregnant, seek counseling before you get pregnant.  Take 400 to 800 micrograms (mcg) of folic acid every day if you become pregnant.  Ask for birth control (contraception) if you want to prevent pregnancy. Osteoporosis and menopause Osteoporosis is a disease in which the bones lose minerals and strength with aging. This can result in bone fractures. If you are 65 years old or older, or if you are at risk for osteoporosis and fractures, ask your health care provider if you should:  Be screened for bone loss.  Take a calcium or vitamin D supplement to lower your risk of fractures.  Be given hormone replacement therapy (HRT) to treat symptoms of menopause. Follow these instructions at home: Lifestyle  Do not use any products that contain nicotine or tobacco, such as cigarettes, e-cigarettes, and chewing tobacco. If you need help quitting, ask your health care provider.  Do not use street drugs.  Do not share needles.  Ask your health care provider for help if you need support or information about quitting drugs. Alcohol use  Do not drink alcohol if: ? Your health care provider tells you not to drink. ? You are pregnant, may be pregnant, or are planning to become pregnant.  If you drink alcohol: ? Limit how much you use to 0-1 drink a day. ? Limit intake if you are breastfeeding.  Be aware of how much alcohol is in your drink. In the [place] one drink equals one 12 oz bottle of beer (355 mL), one 5 oz glass of wine (148 mL), or one 1 oz glass of hard liquor (44 mL). General instructions  Schedule regular health, dental, and eye exams.  Stay current with your vaccines.  Tell your health care provider if: ? You often feel depressed. ? You have ever been abused or do not feel safe at home. Summary  Adopting a healthy lifestyle and getting preventive care are important in promoting health and wellness.  Follow your health care provider's instructions about healthy  diet, exercising, and getting tested or screened for diseases.  Follow your health care provider's instructions on monitoring your cholesterol and blood pressure. This information is not intended to replace advice given to you by your health care provider. Make sure you discuss any questions you have with your health care provider. Document Revised: 12/15/2017 Document Reviewed: 12/15/2017 Elsevier Patient Education  2020 Elsevier Inc.  

## 2019-11-22 LAB — CMP14+EGFR
ALT: 14 IU/L (ref 0–32)
AST: 13 IU/L (ref 0–40)
Albumin/Globulin Ratio: 2 (ref 1.2–2.2)
Albumin: 4.6 g/dL (ref 3.8–4.9)
Alkaline Phosphatase: 96 IU/L (ref 44–121)
BUN/Creatinine Ratio: 9 (ref 9–23)
BUN: 7 mg/dL (ref 6–24)
Bilirubin Total: 0.3 mg/dL (ref 0.0–1.2)
CO2: 27 mmol/L (ref 20–29)
Calcium: 9.9 mg/dL (ref 8.7–10.2)
Chloride: 101 mmol/L (ref 96–106)
Creatinine, Ser: 0.78 mg/dL (ref 0.57–1.00)
GFR calc Af Amer: 100 mL/min/{1.73_m2} (ref >59)
GFR calc non Af Amer: 86 mL/min/{1.73_m2} (ref >59)
Globulin, Total: 2.3 g/dL (ref 1.5–4.5)
Glucose: 101 mg/dL — ABNORMAL HIGH (ref 65–99)
Potassium: 3.9 mmol/L (ref 3.5–5.2)
Sodium: 143 mmol/L (ref 134–144)
Total Protein: 6.9 g/dL (ref 6.0–8.5)

## 2019-11-22 LAB — CBC WITH DIFFERENTIAL/PLATELET
Basophils Absolute: 0 10*3/uL (ref 0.0–0.2)
Basos: 0 %
EOS (ABSOLUTE): 0.1 10*3/uL (ref 0.0–0.4)
Eos: 1 %
Hematocrit: 42.2 % (ref 34.0–46.6)
Hemoglobin: 14.2 g/dL (ref 11.1–15.9)
Immature Grans (Abs): 0 10*3/uL (ref 0.0–0.1)
Immature Granulocytes: 0 %
Lymphocytes Absolute: 1.8 10*3/uL (ref 0.7–3.1)
Lymphs: 26 %
MCH: 29 pg (ref 26.6–33.0)
MCHC: 33.6 g/dL (ref 31.5–35.7)
MCV: 86 fL (ref 79–97)
Monocytes Absolute: 0.4 10*3/uL (ref 0.1–0.9)
Monocytes: 6 %
Neutrophils Absolute: 4.6 10*3/uL (ref 1.4–7.0)
Neutrophils: 67 %
Platelets: 255 10*3/uL (ref 150–450)
RBC: 4.9 x10E6/uL (ref 3.77–5.28)
RDW: 12.1 % (ref 11.7–15.4)
WBC: 6.9 10*3/uL (ref 3.4–10.8)

## 2019-11-22 LAB — LIPID PANEL
Chol/HDL Ratio: 3.5 ratio (ref 0.0–4.4)
Cholesterol, Total: 207 mg/dL — ABNORMAL HIGH (ref 100–199)
HDL: 60 mg/dL (ref >39)
LDL Chol Calc (NIH): 118 mg/dL — ABNORMAL HIGH (ref 0–99)
Triglycerides: 164 mg/dL — ABNORMAL HIGH (ref 0–149)
VLDL Cholesterol Cal: 29 mg/dL (ref 5–40)

## 2019-11-22 LAB — HIV ANTIBODY (ROUTINE TESTING W REFLEX): HIV Screen 4th Generation wRfx: NONREACTIVE

## 2019-11-22 LAB — TSH: TSH: 2.13 u[IU]/mL (ref 0.450–4.500)

## 2019-11-22 LAB — HEPATITIS C ANTIBODY: Hep C Virus Ab: <0.1 s/co ratio (ref 0.0–0.9)

## 2019-12-04 DIAGNOSIS — Z1211 Encounter for screening for malignant neoplasm of colon: Secondary | ICD-10-CM | POA: Diagnosis not present

## 2019-12-13 ENCOUNTER — Other Ambulatory Visit: Payer: Self-pay | Admitting: Family

## 2019-12-13 DIAGNOSIS — R195 Other fecal abnormalities: Secondary | ICD-10-CM

## 2019-12-13 LAB — COLOGUARD: Cologuard: POSITIVE — AB

## 2019-12-14 ENCOUNTER — Telehealth: Payer: Self-pay

## 2019-12-14 NOTE — Telephone Encounter (Signed)
Patient aware of positive cologuard results. Aware that she needs to be referred to GI.  States she does not want to go to GI at this time but if she decides to go she will call back with the place she wants to go to.  Would like current referral to be canceled.

## 2019-12-19 NOTE — Telephone Encounter (Signed)
Ok, I strongly encourage her to follow up with GI as it is possible she could have colon cancer. However, it is her decision.

## 2019-12-19 NOTE — Telephone Encounter (Signed)
fyi

## 2019-12-19 NOTE — Telephone Encounter (Signed)
Pt aware and says she is checking with her insurance and will call back and let us know.

## 2020-02-05 ENCOUNTER — Telehealth: Payer: Self-pay

## 2020-02-05 NOTE — Telephone Encounter (Signed)
Pt called to check on a referral that was suppose to be sent over from [place] one month ago for positive cologuard.

## 2020-03-08 ENCOUNTER — Ambulatory Visit (INDEPENDENT_AMBULATORY_CARE_PROVIDER_SITE_OTHER): Payer: Self-pay | Admitting: Gastroenterology

## 2020-03-08 ENCOUNTER — Encounter: Payer: Self-pay | Admitting: Gastroenterology

## 2020-03-08 ENCOUNTER — Other Ambulatory Visit: Payer: Self-pay

## 2020-03-08 DIAGNOSIS — R195 Other fecal abnormalities: Secondary | ICD-10-CM | POA: Insufficient documentation

## 2020-03-08 MED ORDER — SUPREP BOWEL PREP KIT 17.5-3.13-1.6 GM/177ML PO SOLN: 1.0000 | ORAL | 0 refills | Status: DC

## 2020-03-08 NOTE — Progress Notes (Signed)
Primary Care Physician:  Sharion Balloon, FNP  Primary Gastroenterologist:  Garfield Cornea, MD   Chief Complaint  Patient presents with  . Positive Cologuard    HPI:  Amy Stout is a 55 y.o. female here at the request of Evelina Dun, FNP for further evaluation of positive Cologuard (11/2019). Patient has never had colonoscopy. She has a maternal uncle with history of colon cancer.   She has long history of anxiety, sometimes with abdominal cramping with soft/loose stool but infrequent. She suspects some lactose intolerance as well. No melena, brbpr. No weight loss. GERD for 20 years, on medication. No heartburn. No dysphagia. If tried to come off medication, then has recurrent heartburn.   No prior colonoscopy.     Current Outpatient Medications  Medication Sig Dispense Refill  . esomeprazole (NEXIUM) 40 MG capsule Take 1 capsule (40 mg total) by mouth daily at 12 noon. (Needs to be seen before next refill) 90 capsule 3  . SUMAtriptan (IMITREX) 50 MG tablet Take 1 tablet (50 mg total) by mouth every 2 (two) hours as needed for migraine. May repeat in 2 hours if headache persists or recurs. (Patient taking differently: Take 50 mg by mouth as needed for migraine. May repeat in 2 hours if headache persists or recurs.) 10 tablet 4   No current facility-administered medications for this visit.    Allergies as of 03/08/2020  . (No Known Allergies)    Past Medical History:  Diagnosis Date  . Anxiety   . GERD (gastroesophageal reflux disease)   . Migraines     Past Surgical History:  Procedure Laterality Date  . none      Family History  Problem Relation Age of Onset  . Arthritis Mother   . Depression Mother   . Hearing loss Mother   . Miscarriages / [place] Mother   . Alcohol abuse Father   . Cancer Father        prostate  . Diabetes Father   . Heart disease Father   . Asthma Maternal Aunt   . Asthma Maternal Uncle   . Uterine cancer Paternal Grandmother   .  Stroke Paternal Grandfather   . Colon cancer Maternal Uncle     Social History   Socioeconomic History  . Marital status: Married    Spouse name: Not on file  . Number of children: Not on file  . Years of education: Not on file  . Highest education level: Not on file  Occupational History  . Not on file  Tobacco Use  . Smoking status: Never Smoker  . Smokeless tobacco: Never Used  Substance and Sexual Activity  . Alcohol use: No  . Drug use: No  . Sexual activity: Not on file  Other Topics Concern  . Not on file  Social History Narrative  . Not on file   Social Determinants of Health   Financial Resource Strain: Not on file  Food Insecurity: Not on file  Transportation Needs: Not on file  Physical Activity: Not on file  Stress: Not on file  Social Connections: Not on file  Intimate Partner Violence: Not on file      ROS:  General: Negative for anorexia, weight loss, fever, chills, fatigue, weakness. Eyes: Negative for vision changes.  ENT: Negative for hoarseness, difficulty swallowing , nasal congestion. CV: Negative for chest pain, angina, palpitations, dyspnea on exertion, peripheral edema.  Respiratory: Negative for dyspnea at rest, dyspnea on exertion, cough, sputum, wheezing.  GI: See history  of present illness. GU:  Negative for dysuria, hematuria, urinary incontinence, urinary frequency, nocturnal urination.  MS: Negative for joint pain, low back pain.  Derm: Negative for rash or itching.  Neuro: Negative for weakness, abnormal sensation, seizure, frequent headaches, memory loss, confusion.  Psych: Negative for anxiety, depression, suicidal ideation, hallucinations.  Endo: Negative for unusual weight change.  Heme: Negative for bruising or bleeding. Allergy: Negative for rash or hives.    Physical Examination:  BP (!) 161/102   Pulse 76   Temp (!) 97.1 F (36.2 C) (Temporal)   Ht 5\' 6"  (1.676 m)   Wt 184 lb 6.4 oz (83.6 kg)   BMI 29.76 kg/m     General: Well-nourished, well-developed in no acute distress.  Head: Normocephalic, atraumatic.   Eyes: Conjunctiva pink, no icterus. Mouth: masked Neck: Supple without thyromegaly, masses, or lymphadenopathy.  Lungs: Clear to auscultation bilaterally.  Heart: Regular rate and rhythm, no murmurs rubs or gallops.  Abdomen: Bowel sounds are normal, nontender, nondistended, no hepatosplenomegaly or masses, no abdominal bruits or    hernia , no rebound or guarding.   Rectal: not performed Extremities: No lower extremity edema. No clubbing or deformities.  Neuro: Alert and oriented x 4 , grossly normal neurologically.  Skin: Warm and dry, no rash or jaundice.   Psych: Alert and cooperative, normal mood and affect.  Labs: Lab Results  Component Value Date   CREATININE 0.78 11/21/2019   BUN 7 11/21/2019   NA 143 11/21/2019   K 3.9 11/21/2019   CL 101 11/21/2019   CO2 27 11/21/2019   Lab Results  Component Value Date   TSH 2.130 11/21/2019   Lab Results  Component Value Date   WBC 6.9 11/21/2019   HGB 14.2 11/21/2019   HCT 42.2 11/21/2019   MCV 86 11/21/2019   PLT 255 11/21/2019   Lab Results  Component Value Date   ALT 14 11/21/2019   AST 13 11/21/2019   ALKPHOS 96 11/21/2019   BILITOT 0.3 11/21/2019     Imaging Studies: No results found.  Assessment/Plan:  55 y/o female with history of positive Cologuard and no prior colonoscopy.  Recommend colonoscopy with propofol. ASA I.  I have discussed the risks, alternatives, benefits with regards to but not limited to the risk of reaction to medication, bleeding, infection, perforation and the patient is agreeable to proceed. Written consent to be obtained.  She has chronic GERD, on medication for 20 years, unable to come off due to recurrent symptoms. We discussed screening for Barrett's esophagus but she is not interested at this time.

## 2020-03-08 NOTE — Patient Instructions (Signed)
1. Colonoscopy as scheduled. See separate instructions.  2. Consider upper endoscopy for chronic reflux. You are at increased risk of Barrett's esophagus which is a precursor for esophageal cancer. Let me know if you decide to pursue testing.

## 2020-03-11 ENCOUNTER — Telehealth: Payer: Self-pay | Admitting: Internal Medicine

## 2020-03-11 MED ORDER — PEG 3350-KCL-NA BICARB-NACL 420 G PO SOLR: 4000.0000 mL | ORAL | 0 refills | Status: DC

## 2020-03-11 NOTE — Telephone Encounter (Signed)
Rx for Tri-Lyte sent to pharmacy. New instructions mailed. Called and informed pt. 

## 2020-03-11 NOTE — Telephone Encounter (Signed)
PATIENT CALLED AND SAID THE PREP THAT WAS CALLED IN IS OVER 100$, CAN SOMETHING ELSE BE SENT IN AND PLEASE CALL HER WHEN YOU SEND IT

## 2020-03-11 NOTE — Progress Notes (Signed)
CC'ED TO PCP 

## 2020-03-11 NOTE — Addendum Note (Signed)
Addended by: Hassan Rowan on: 03/11/2020 04:37 PM   Modules accepted: Orders

## 2020-04-16 ENCOUNTER — Telehealth: Payer: Self-pay

## 2020-04-16 NOTE — Telephone Encounter (Signed)
Patient returned called and is aware of arrival time. Endo aware she knows

## 2020-04-16 NOTE — Telephone Encounter (Signed)
Melanie at endo called office, she has tried to call pt 4x to give her arrival time for TCS 04/18/20. Arrival time is 10:45am.  Tried to call pt, LMOVM for return call.

## 2020-04-17 ENCOUNTER — Other Ambulatory Visit (HOSPITAL_COMMUNITY)
Admission: RE | Admit: 2020-04-17 | Discharge: 2020-04-17 | Disposition: A | Discharge status: Home / Self Care | Payer: BC Managed Care – PPO | Source: Ambulatory Visit | Attending: Internal Medicine | Admitting: Internal Medicine

## 2020-04-17 ENCOUNTER — Other Ambulatory Visit: Payer: Self-pay

## 2020-04-17 DIAGNOSIS — R195 Other fecal abnormalities: Secondary | ICD-10-CM | POA: Diagnosis not present

## 2020-04-17 DIAGNOSIS — D125 Benign neoplasm of sigmoid colon: Secondary | ICD-10-CM | POA: Diagnosis not present

## 2020-04-17 DIAGNOSIS — Z01812 Encounter for preprocedural laboratory examination: Secondary | ICD-10-CM | POA: Insufficient documentation

## 2020-04-17 DIAGNOSIS — Z20822 Contact with and (suspected) exposure to covid-19: Secondary | ICD-10-CM | POA: Insufficient documentation

## 2020-04-17 DIAGNOSIS — D123 Benign neoplasm of transverse colon: Secondary | ICD-10-CM | POA: Diagnosis not present

## 2020-04-17 DIAGNOSIS — Z800 Family history of malignant neoplasm of digestive organs: Secondary | ICD-10-CM | POA: Diagnosis not present

## 2020-04-17 DIAGNOSIS — Z79899 Other long term (current) drug therapy: Secondary | ICD-10-CM | POA: Diagnosis not present

## 2020-04-17 LAB — SARS CORONAVIRUS 2 (TAT 6-24 HRS): SARS Coronavirus 2: NEGATIVE

## 2020-04-18 ENCOUNTER — Other Ambulatory Visit: Payer: Self-pay

## 2020-04-18 ENCOUNTER — Ambulatory Visit (HOSPITAL_COMMUNITY): Payer: BC Managed Care – PPO | Admitting: Anesthesiology

## 2020-04-18 ENCOUNTER — Encounter (HOSPITAL_COMMUNITY): Payer: Self-pay | Admitting: Internal Medicine

## 2020-04-18 ENCOUNTER — Ambulatory Visit (HOSPITAL_COMMUNITY)
Admission: RE | Admit: 2020-04-18 | Discharge: 2020-04-18 | Disposition: A | Discharge status: Home / Self Care | Payer: BC Managed Care – PPO | Attending: Internal Medicine | Admitting: Internal Medicine

## 2020-04-18 ENCOUNTER — Encounter (HOSPITAL_COMMUNITY)
Admission: RE | Disposition: A | Discharge status: Home / Self Care | Payer: Self-pay | Source: Home / Self Care | Attending: Internal Medicine

## 2020-04-18 DIAGNOSIS — D123 Benign neoplasm of transverse colon: Secondary | ICD-10-CM | POA: Diagnosis not present

## 2020-04-18 DIAGNOSIS — Z800 Family history of malignant neoplasm of digestive organs: Secondary | ICD-10-CM | POA: Diagnosis not present

## 2020-04-18 DIAGNOSIS — Z20822 Contact with and (suspected) exposure to covid-19: Secondary | ICD-10-CM | POA: Diagnosis not present

## 2020-04-18 DIAGNOSIS — R195 Other fecal abnormalities: Secondary | ICD-10-CM | POA: Diagnosis not present

## 2020-04-18 DIAGNOSIS — Z79899 Other long term (current) drug therapy: Secondary | ICD-10-CM | POA: Diagnosis not present

## 2020-04-18 DIAGNOSIS — F411 Generalized anxiety disorder: Secondary | ICD-10-CM | POA: Diagnosis not present

## 2020-04-18 DIAGNOSIS — K635 Polyp of colon: Secondary | ICD-10-CM | POA: Diagnosis not present

## 2020-04-18 DIAGNOSIS — D125 Benign neoplasm of sigmoid colon: Secondary | ICD-10-CM | POA: Diagnosis not present

## 2020-04-18 HISTORY — PX: COLONOSCOPY WITH PROPOFOL: SHX5780

## 2020-04-18 HISTORY — PX: POLYPECTOMY: SHX5525

## 2020-04-18 SURGERY — COLONOSCOPY WITH PROPOFOL
Anesthesia: General

## 2020-04-18 MED ORDER — STERILE WATER FOR IRRIGATION IR SOLN
Status: DC | PRN
  Administered 2020-04-18: 100 mL

## 2020-04-18 MED ORDER — PROPOFOL 10 MG/ML IV BOLUS
INTRAVENOUS | Status: DC | PRN
  Administered 2020-04-18 (×2): 100 mg via INTRAVENOUS
  Administered 2020-04-18: 20 mg via INTRAVENOUS

## 2020-04-18 MED ORDER — LACTATED RINGERS IV SOLN: INTRAVENOUS | Status: DC

## 2020-04-18 MED ORDER — LIDOCAINE HCL (CARDIAC) PF 50 MG/5ML IV SOSY
PREFILLED_SYRINGE | INTRAVENOUS | Status: DC | PRN
  Administered 2020-04-18: 50 mg via INTRAVENOUS

## 2020-04-18 MED ORDER — PROPOFOL 500 MG/50ML IV EMUL
INTRAVENOUS | Status: DC | PRN
  Administered 2020-04-18: 125 ug/kg/min via INTRAVENOUS

## 2020-04-18 NOTE — Anesthesia Procedure Notes (Signed)
Date/Time: 04/18/2020 11:23 AM Performed by: Vista Deck, CRNA Pre-anesthesia Checklist: Patient identified, Emergency Drugs available, Suction available, Timeout performed and Patient being monitored Patient Re-evaluated:Patient Re-evaluated prior to induction Oxygen Delivery Method: Nasal Cannula

## 2020-04-18 NOTE — Anesthesia Postprocedure Evaluation (Signed)
Anesthesia Post Note  Patient: Amy Stout  Procedure(s) Performed: COLONOSCOPY WITH PROPOFOL (N/A ) POLYPECTOMY  Patient location during evaluation: Endoscopy Anesthesia Type: General Level of consciousness: awake and alert and oriented Pain management: pain level controlled Vital Signs Assessment: post-procedure vital signs reviewed and stable Respiratory status: spontaneous breathing and respiratory function stable Cardiovascular status: blood pressure returned to baseline and stable Postop Assessment: no apparent nausea or vomiting Anesthetic complications: no   No complications documented.   Last Vitals:  Vitals:   04/18/20 1031 04/18/20 1149  BP: (!) 145/85 (!) 102/59  Pulse: 79 64  Resp: 17 16  Temp:  36.8 C  SpO2: 100% 100%    Last Pain:  Vitals:   04/18/20 1149  TempSrc: Oral  PainSc: 0-No pain                 Melvie Paglia C Kacee Sukhu

## 2020-04-18 NOTE — Transfer of Care (Signed)
Immediate Anesthesia Transfer of Care Note  Patient: Amy Stout  Procedure(s) Performed: COLONOSCOPY WITH PROPOFOL (N/A ) POLYPECTOMY  Patient Location: Endoscopy Unit  Anesthesia Type:General  Level of Consciousness: awake and patient cooperative  Airway & Oxygen Therapy: Patient Spontanous Breathing  Post-op Assessment: Report given to RN and Post -op Vital signs reviewed and stable  Post vital signs: Reviewed and stable  Last Vitals:  Vitals Value Taken Time  BP    Temp    Pulse    Resp    SpO2    SEE VITAL SIGN FLOW SHEET  Last Pain:  Vitals:   04/18/20 1124  TempSrc:   PainSc: 0-No pain      Patients Stated Pain Goal: 9 (24/93/24 1991)  Complications: No complications documented.

## 2020-04-18 NOTE — Discharge Instructions (Signed)
Colonoscopy Discharge Instructions  Read the instructions outlined below and refer to this sheet in the next few weeks. These discharge instructions provide you with general information on caring for yourself after you leave the hospital. Your doctor may also give you specific instructions. While your treatment has been planned according to the most current medical practices available, unavoidable complications occasionally occur. If you have any problems or questions after discharge, call Dr. Gala Romney at [phone]. ACTIVITY  You may resume your regular activity, but move at a slower pace for the next 24 hours.   Take frequent rest periods for the next 24 hours.   Walking will help get rid of the air and reduce the bloated feeling in your belly (abdomen).   No driving for 24 hours (because of the medicine (anesthesia) used during the test).    Do not sign any important legal documents or operate any machinery for 24 hours (because of the anesthesia used during the test).  NUTRITION  Drink plenty of fluids.   You may resume your normal diet as instructed by your doctor.   Begin with a light meal and progress to your normal diet. Heavy or fried foods are harder to digest and may make you feel sick to your stomach (nauseated).   Avoid alcoholic beverages for 24 hours or as instructed.  MEDICATIONS  You may resume your normal medications unless your doctor tells you otherwise.  WHAT YOU CAN EXPECT TODAY  Some feelings of bloating in the abdomen.   Passage of more gas than usual.   Spotting of blood in your stool or on the toilet paper.  IF YOU HAD POLYPS REMOVED DURING THE COLONOSCOPY:  No aspirin products for 7 days or as instructed.   No alcohol for 7 days or as instructed.   Eat a soft diet for the next 24 hours.  FINDING OUT THE RESULTS OF YOUR TEST Not all test results are available during your visit. If your test results are not back during the visit, make an appointment  with your caregiver to find out the results. Do not assume everything is normal if you have not heard from your caregiver or the medical facility. It is important for you to follow up on all of your test results.  SEEK IMMEDIATE MEDICAL ATTENTION IF:  You have more than a spotting of blood in your stool.   Your belly is swollen (abdominal distention).   You are nauseated or vomiting.   You have a temperature over 101.   You have abdominal pain or discomfort that is severe or gets worse throughout the day.    2 polyps removed your colon today  Further recommendations to follow pending review of pathology report  At patient request, I called Vaishali Baise at [phone]   Colon Polyps  Colon polyps are tissue growths inside the colon, which is part of the large intestine. They are one of the types of polyps that can grow in the body. A polyp may be a round bump or a mushroom-shaped growth. You could have one polyp or more than one. Most colon polyps are noncancerous (benign). However, some colon polyps can become cancerous over time. Finding and removing the polyps early can help prevent this. What are the causes? The exact cause of colon polyps is not known. What increases the risk? The following factors may make you more likely to develop this condition:  Having a family history of colorectal cancer or colon polyps.  Being older than 56  years of age.  Being younger than 56 years of age and having a significant family history of colorectal cancer or colon polyps or a genetic condition that puts you at higher risk of getting colon polyps.  Having inflammatory bowel disease, such as ulcerative colitis or Crohn's disease.  Having certain conditions passed from parent to child (hereditary conditions), such as: ? Familial adenomatous polyposis (FAP). ? Lynch syndrome. ? Turcot syndrome. ? Peutz-Jeghers syndrome. ? MUTYH-associated polyposis (MAP).  Being overweight.  Certain  lifestyle factors. These include smoking cigarettes, drinking too much alcohol, not getting enough exercise, and eating a diet that is high in fat and red meat and low in fiber.  Having had childhood cancer that was treated with radiation of the abdomen. What are the signs or symptoms? Many times, there are no symptoms. If you have symptoms, they may include:  Blood coming from the rectum during a bowel movement.  Blood in the stool (feces). The blood may be bright red or very dark in color.  Pain in the abdomen.  A change in bowel habits, such as constipation or diarrhea. How is this diagnosed? This condition is diagnosed with a colonoscopy. This is a procedure in which a lighted, flexible scope is inserted into the opening between the buttocks (anus) and then passed into the colon to examine the area. Polyps are sometimes found when a colonoscopy is done as part of routine cancer screening tests. How is this treated? This condition is treated by removing any polyps that are found. Most polyps can be removed during a colonoscopy. Those polyps will then be tested for cancer. Additional treatment may be needed depending on the results of testing. Follow these instructions at home: Eating and drinking  Eat foods that are high in fiber, such as fruits, vegetables, and whole grains.  Eat foods that are high in calcium and vitamin D, such as milk, cheese, yogurt, eggs, liver, fish, and broccoli.  Limit foods that are high in fat, such as fried foods and desserts.  Limit the amount of red meat, precooked or cured meat, or other processed meat that you eat, such as hot dogs, sausages, bacon, or meat loaves.  Limit sugary drinks.   Lifestyle  Maintain a healthy weight, or lose weight if recommended by your health care provider.  Exercise every day or as told by your health care provider.  Do not use any products that contain nicotine or tobacco, such as cigarettes, e-cigarettes, and  chewing tobacco. If you need help quitting, ask your health care provider.  Do not drink alcohol if: ? Your health care provider tells you not to drink. ? You are pregnant, may be pregnant, or are planning to become pregnant.  If you drink alcohol: ? Limit how much you use to:  0-1 drink a day for women.  0-2 drinks a day for men. ? Know how much alcohol is in your drink. In the [place] one drink equals one 12 oz bottle of beer (355 mL), one 5 oz glass of wine (148 mL), or one 1 oz glass of hard liquor (44 mL). General instructions  Take over-the-counter and prescription medicines only as told by your health care provider.  Keep all follow-up visits. This is important. This includes having regularly scheduled colonoscopies. Talk to your health care provider about when you need a colonoscopy. Contact a health care provider if:  You have new or worsening bleeding during a bowel movement.  You have new or increased blood in  your stool.  You have a change in bowel habits.  You lose weight for no known reason. Summary  Colon polyps are tissue growths inside the colon, which is part of the large intestine. They are one type of polyp that can grow in the body.  Most colon polyps are noncancerous (benign), but some can become cancerous over time.  This condition is diagnosed with a colonoscopy.  This condition is treated by removing any polyps that are found. Most polyps can be removed during a colonoscopy. This information is not intended to replace advice given to you by your health care provider. Make sure you discuss any questions you have with your health care provider. Document Revised: 04/12/2019 Document Reviewed: 04/12/2019 Elsevier Patient Education  2021 [employer].

## 2020-04-18 NOTE — Anesthesia Preprocedure Evaluation (Signed)
Anesthesia Evaluation  Patient identified by MRN, date of birth, ID band Patient awake    Reviewed: Allergy & Precautions, NPO status , Patient's Chart, lab work & pertinent test results  Airway Mallampati: III  TM Distance: >3 FB Neck ROM: Full    Dental  (+) Dental Advisory Given, Caps   Pulmonary neg sleep apnea (snoring),    Pulmonary exam normal breath sounds clear to auscultation       Cardiovascular Exercise Tolerance: Good Normal cardiovascular exam Rhythm:Regular Rate:Normal     Neuro/Psych  Headaches, PSYCHIATRIC DISORDERS Anxiety    GI/Hepatic Neg liver ROS, GERD  Medicated and Controlled,  Endo/Other  negative endocrine ROS  Renal/GU negative Renal ROS     Musculoskeletal negative musculoskeletal ROS (+)   Abdominal   Peds  Hematology negative hematology ROS (+)   Anesthesia Other Findings Snoring   Reproductive/Obstetrics negative OB ROS                             Anesthesia Physical Anesthesia Plan  ASA: II  Anesthesia Plan: General   Post-op Pain Management:    Induction: Intravenous  PONV Risk Score and Plan: Propofol infusion  Airway Management Planned: Nasal Cannula and Natural Airway  Additional Equipment:   Intra-op Plan:   Post-operative Plan:   Informed Consent: I have reviewed the patients History and Physical, chart, labs and discussed the procedure including the risks, benefits and alternatives for the proposed anesthesia with the patient or authorized representative who has indicated his/her understanding and acceptance.     Dental advisory given  Plan Discussed with: CRNA and Surgeon  Anesthesia Plan Comments:         Anesthesia Quick Evaluation

## 2020-04-18 NOTE — Op Note (Signed)
[facility] Patient Name: Amy Stout Procedure Date: 04/18/2020 11:00 AM MRN: [id number] Date of Birth: 1965/03/04 Attending MD: Norvel Richards , MD CSN: [id number] Age: 55 Admit Type: Outpatient Procedure:                Colonoscopy Indications:              Positive Cologuard test Providers:                Norvel Richards, MD, Lambert Mody, Aram Candela Referring MD:              Medicines:                Propofol per Anesthesia Complications:            No immediate complications. Estimated Blood Loss:     Estimated blood loss was minimal. Procedure:                Pre-Anesthesia Assessment:                           - Prior to the procedure, a History and Physical                            was performed, and patient medications and                            allergies were reviewed. The patient's tolerance of                            previous anesthesia was also reviewed. The risks                            and benefits of the procedure and the sedation                            options and risks were discussed with the patient.                            All questions were answered, and informed consent                            was obtained. Prior Anticoagulants: The patient has                            taken no previous anticoagulant or antiplatelet                            agents. ASA Grade Assessment: II - A patient with                            mild systemic disease. After reviewing the risks  and benefits, the patient was deemed in                            satisfactory condition to undergo the procedure.                           After obtaining informed consent, the colonoscope                            was passed under direct vision. Throughout the                            procedure, the patient's blood pressure, pulse, and                            oxygen saturations were  monitored continuously. The                            CF-HQ190L ([id number]) scope was introduced through                            the anus and advanced to the the cecum, identified                            by appendiceal orifice and ileocecal valve. The                            colonoscopy was performed without difficulty. The                            patient tolerated the procedure well. The quality                            of the bowel preparation was adequate. Scope In: 11:29:02 AM Scope Out: 11:44:59 AM Scope Withdrawal Time: 0 hours 10 minutes 37 seconds  Total Procedure Duration: 0 hours 15 minutes 57 seconds  Findings:      The perianal and digital rectal examinations were normal.      Two sessile polyps were found in the sigmoid colon and splenic flexure.       The polyps were 3 to 4 mm in size. These polyps were removed with a cold       snare. Resection and retrieval were complete. Estimated blood loss was       minimal.      The exam was otherwise without abnormality on direct and retroflexion       views aside from minimal internal hemorrhoids and anal papilla. Impression:               - Two 3 to 4 mm polyps in the sigmoid colon and at                            the splenic flexure, removed with a cold snare.  Resected and retrieved.                           - The examination was otherwise normal on direct                            and retroflexion views. Moderate Sedation:      Moderate (conscious) sedation was personally administered by an       anesthesia professional. The following parameters were monitored: oxygen       saturation, heart rate, blood pressure, respiratory rate, EKG, adequacy       of pulmonary ventilation, and response to care. Recommendation:           - Patient has a contact number available for                            emergencies. The signs and symptoms of potential                            delayed  complications were discussed with the                            patient. Return to normal activities tomorrow.                            Written discharge instructions were provided to the                            patient.                           - Advance diet as tolerated.                           - Continue present medications.                           - Repeat colonoscopy date to be determined after                            pending pathology results are reviewed for                            surveillance.                           - Return to GI office (date not yet determined). Procedure Code(s):        --- Professional ---                           [phone], Colonoscopy, flexible; with removal of                            tumor(s), polyp(s), or other lesion(s) by snare  technique Diagnosis Code(s):        --- Professional ---                           K63.5, Polyp of colon                           R19.5, Other fecal abnormalities CPT copyright 2019 American Medical Association. All rights reserved. The codes documented in this report are preliminary and upon coder review may  be revised to meet current compliance requirements. Cristopher Estimable. Soliyana Mcchristian, MD Norvel Richards, MD 04/18/2020 11:55:14 AM This report has been signed electronically. Number of Addenda: 0

## 2020-04-18 NOTE — H&P (Signed)
_0 @   Primary Care Physician:  Sharion Balloon, FNP Primary Gastroenterologist:  Dr. Gala Romney  Pre-Procedure History & Physical: HPI:  Amy Stout is a 55 y.o. female here for for further evaluation of a positive Cologuard via diagnostic colonoscopy.  No bowel symptoms.  No prior colonoscopy.  No first-degree relatives with colon cancer.  Past Medical History:  Diagnosis Date  . Anxiety   . GERD (gastroesophageal reflux disease)   . Migraines     Past Surgical History:  Procedure Laterality Date  . none      Prior to Admission medications   Medication Sig Start Date End Date Taking? Authorizing Provider  amitriptyline (ELAVIL) 25 MG tablet Take 25 mg by mouth at bedtime as needed for sleep.   Yes [provider]  esomeprazole (NEXIUM) 40 MG capsule Take 1 capsule (40 mg total) by mouth daily at 12 noon. (Needs to be seen before next refill) 11/21/19  Yes Hawks, Christy A, FNP  fluticasone (FLONASE) 50 MCG/ACT nasal spray Place 1 spray into both nostrils daily as needed for allergies.   Yes [provider]  polyethylene glycol-electrolytes (TRILYTE) 420 g solution Take 4,000 mLs by mouth as directed. 03/11/20  Yes Angeletta Goelz, Cristopher Estimable, MD  SUMAtriptan (IMITREX) 50 MG tablet Take 1 tablet (50 mg total) by mouth every 2 (two) hours as needed for migraine. May repeat in 2 hours if headache persists or recurs. Patient taking differently: Take 50 mg by mouth daily as needed for migraine. May repeat in 2 hours if headache persists or recurs. 11/21/19  Yes Hawks, Christy A, FNP  Na Sulfate-K Sulfate-Mg Sulf (SUPREP BOWEL PREP KIT) 17.5-3.13-1.6 GM/177ML SOLN Take 1 kit by mouth as directed. 03/08/20   Daneil Dolin, MD    Allergies as of 03/08/2020  . (No Known Allergies)    Family History  Problem Relation Age of Onset  . Arthritis Mother   . Depression Mother   . Hearing loss Mother   . Miscarriages / [place] Mother   . Alcohol abuse Father   . Cancer Father         prostate  . Diabetes Father   . Heart disease Father   . Asthma Maternal Aunt   . Asthma Maternal Uncle   . Uterine cancer Paternal Grandmother   . Stroke Paternal Grandfather   . Colon cancer Maternal Uncle     Social History   Socioeconomic History  . Marital status: Married    Spouse name: Not on file  . Number of children: Not on file  . Years of education: Not on file  . Highest education level: Not on file  Occupational History  . Not on file  Tobacco Use  . Smoking status: Never Smoker  . Smokeless tobacco: Never Used  Substance and Sexual Activity  . Alcohol use: No  . Drug use: No  . Sexual activity: Not on file  Other Topics Concern  . Not on file  Social History Narrative  . Not on file   Social Determinants of Health   Financial Resource Strain: Not on file  Food Insecurity: Not on file  Transportation Needs: Not on file  Physical Activity: Not on file  Stress: Not on file  Social Connections: Not on file  Intimate Partner Violence: Not on file    Review of Systems: See HPI, otherwise negative ROS  Physical Exam: BP (!) 145/85   Pulse 79   Temp 98.4 F (36.9 C) (Oral)   Resp  17   Ht _0  (1.676 m)   Wt 81.6 kg   SpO2 100%   BMI 29.05 kg/m  General:   Alert,  Well-developed, well-nourished, pleasant and cooperative in NAD Neck:  Supple; no masses or thyromegaly. No significant cervical adenopathy. Lungs:  Clear throughout to auscultation.   No wheezes, crackles, or rhonchi. No acute distress. Heart:  Regular rate and rhythm; no murmurs, clicks, rubs,  or gallops. Abdomen: Non-distended, normal bowel sounds.  Soft and nontender without appreciable mass or hepatosplenomegaly.  Pulses:  Normal pulses noted. Extremities:  Without clubbing or edema.  Impression/Plan: 55 year old lady here for diagnostic colonoscopy due to a positive Cologuard. The risks, benefits, limitations, alternatives and imponderables have been reviewed with the  patient. Questions have been answered. All parties are agreeable.      Notice: This dictation was prepared with Dragon dictation along with smaller phrase technology. Any transcriptional errors that result from this process are unintentional and may not be corrected upon review.

## 2020-04-19 LAB — SURGICAL PATHOLOGY

## 2020-04-23 ENCOUNTER — Encounter (HOSPITAL_COMMUNITY): Payer: Self-pay | Admitting: Internal Medicine

## 2020-04-23 ENCOUNTER — Encounter: Payer: Self-pay | Admitting: Internal Medicine

## 2020-05-17 IMAGING — US SOFT TISSUE ULTRASOUND HEAD/NECK
1 series · 14 of 25 positions shown · non-contrast
Comparison: None.

CLINICAL DATA: Goiter on exam

EXAM:
THYROID ULTRASOUND
TECHNIQUE: Ultrasound examination of the thyroid gland and adjacent soft
tissues was performed.

[Series 1: soft tissue ultrasound head/neck · 14 of 32 slices shown]
[im 1/32]
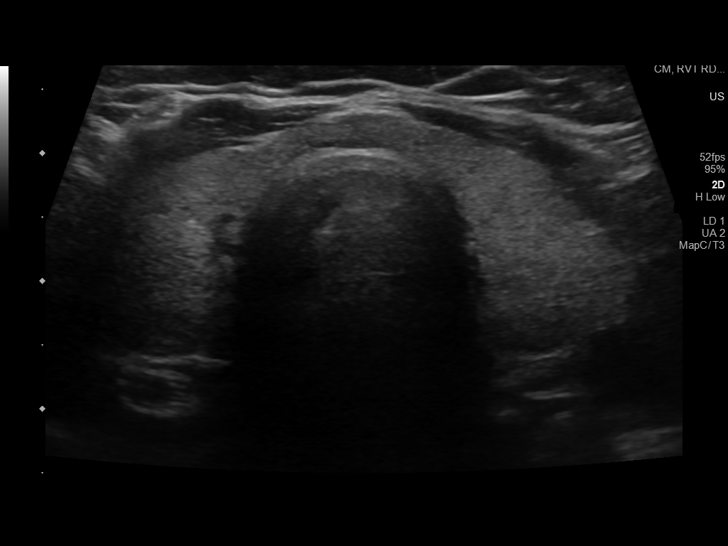
[im 3/32]
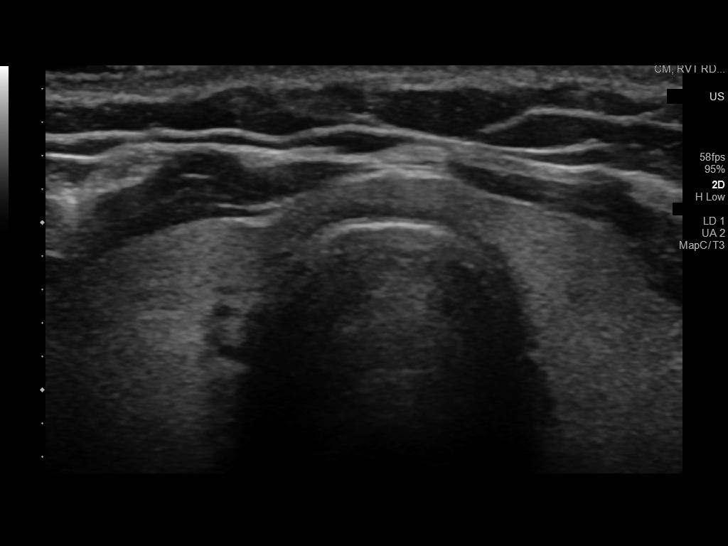
[im 6/32]
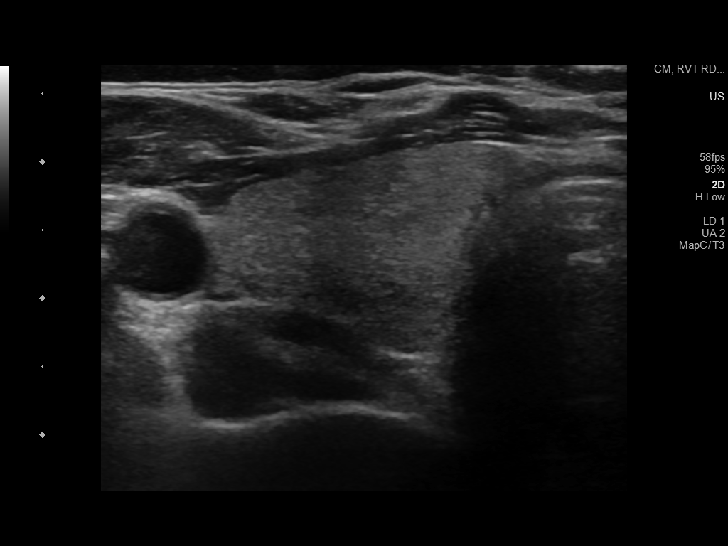
[im 8/32]
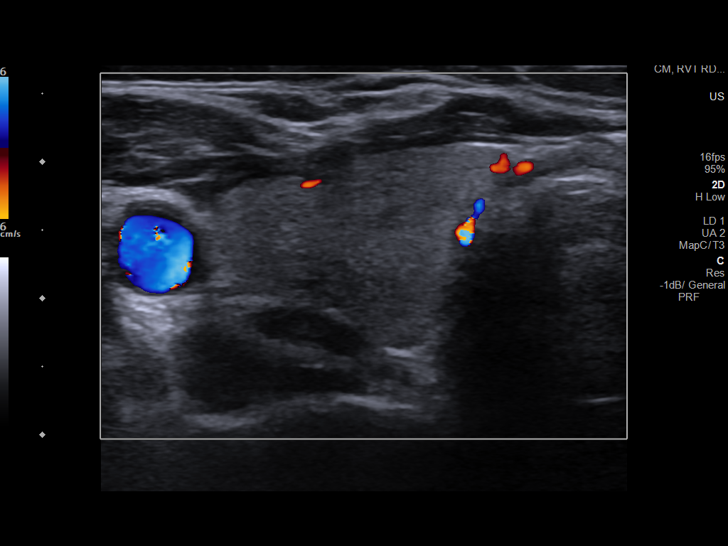
[im 11/32]
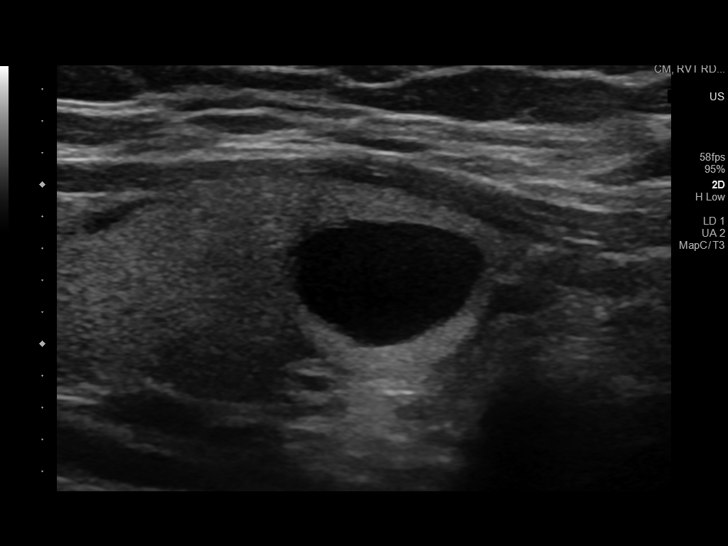
[im 12/32]
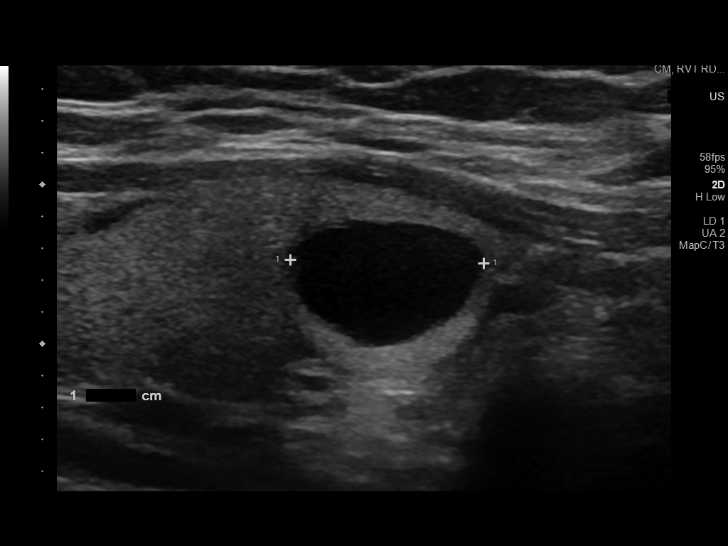
[im 15/32]
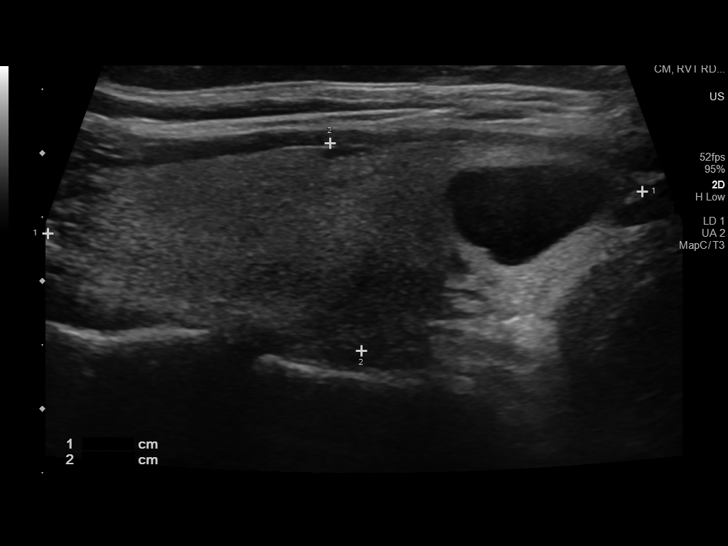
[im 17/32]
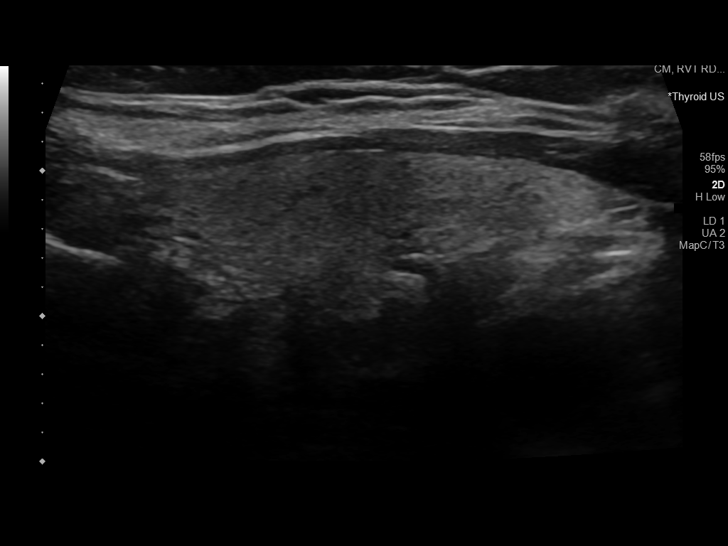
[im 20/32]
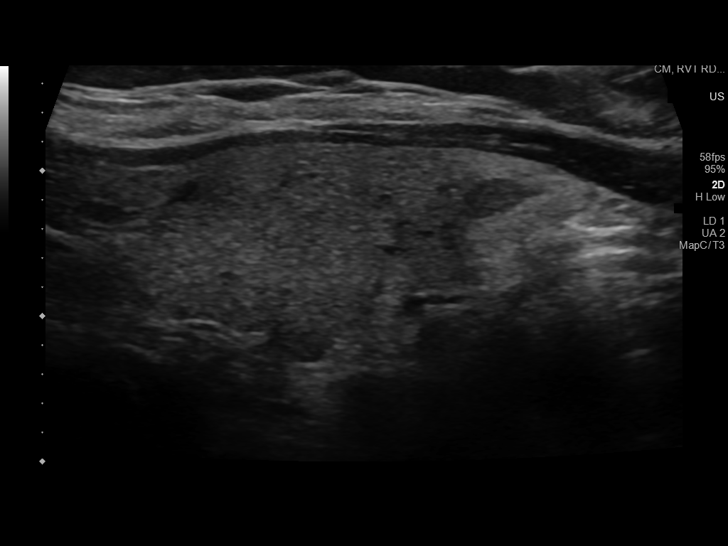
[im 21/32]
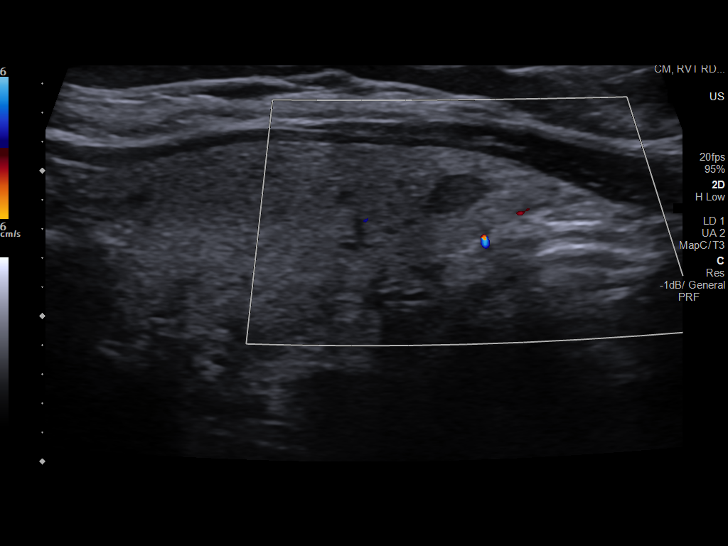
[im 24/32]
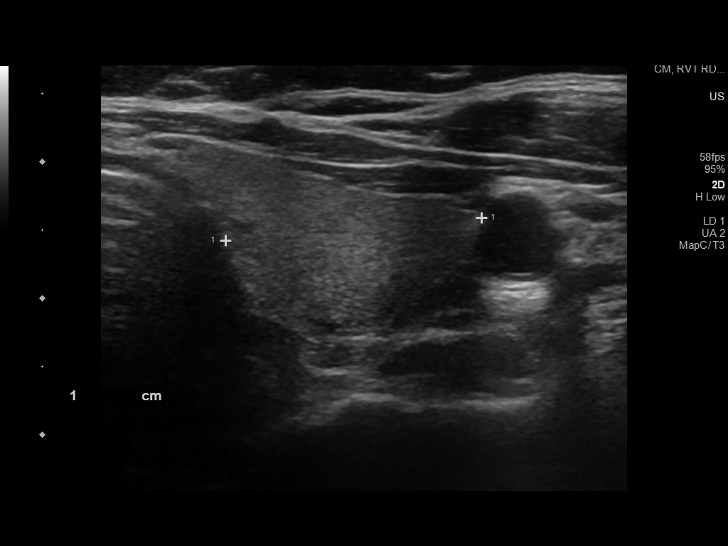
[im 26/32]
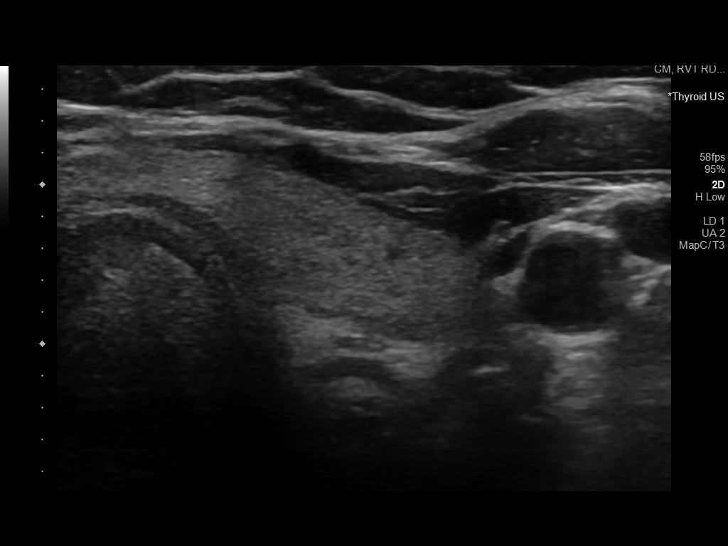
[im 29/32]
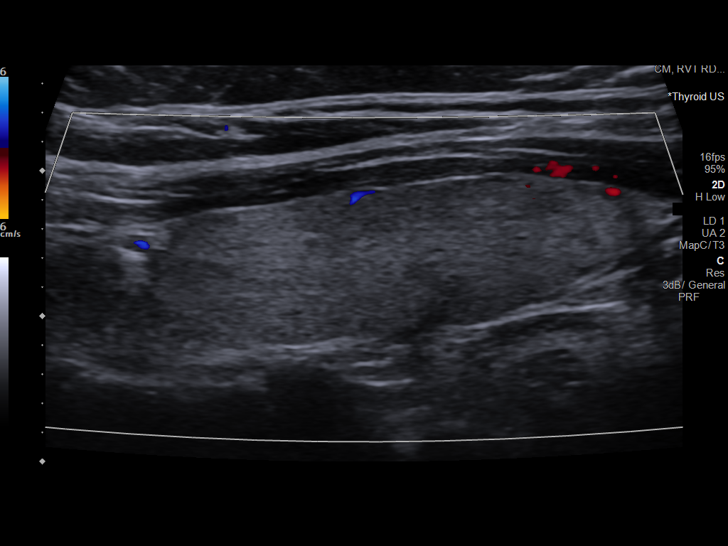
[im 32/32]
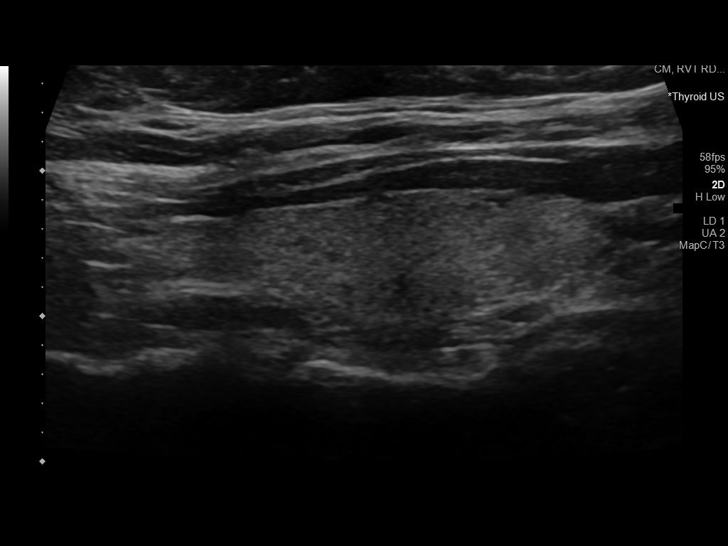

[14 of 25 positions shown; findings below may reference images not displayed]

FINDINGS: Parenchymal Echotexture: Mildly heterogenous

Isthmus: 0.2 cm thickness

Right lobe: 4.7 x 1.6 x 1.9 cm

Left lobe: 3.7 x 1.1 x 1.9 cm

_________________________________________________________

Estimated total number of nodules >/= 1 cm: 1

Number of spongiform nodules >/=  2 cm not described below (TR1): 0

Number of mixed cystic and solid nodules >/= 1.5 cm not described
below (TR2): 0

_________________________________________________________

1.2 cm benign colloid cyst, inferior right; This nodule does NOT
meet TI-RADS criteria for biopsy or dedicated follow-up.
IMPRESSION: 1. Normal-sized thyroid with single right benign colloid cyst. No
indication for biopsy or dedicated imaging follow-up.

The above is in keeping with the ACR TI-RADS recommendations - [HOSPITAL] 5052;[DATE].

## 2020-05-27 ENCOUNTER — Ambulatory Visit (INDEPENDENT_AMBULATORY_CARE_PROVIDER_SITE_OTHER): Payer: BC Managed Care – PPO | Admitting: Nurse Practitioner

## 2020-05-27 ENCOUNTER — Encounter: Payer: Self-pay | Admitting: Nurse Practitioner

## 2020-05-27 DIAGNOSIS — J069 Acute upper respiratory infection, unspecified: Secondary | ICD-10-CM | POA: Insufficient documentation

## 2020-05-27 MED ORDER — DM-GUAIFENESIN ER 30-600 MG PO TB12
1.0000 | ORAL_TABLET | Freq: Two times a day (BID) | ORAL | 0 refills | Status: DC
Start: 2020-05-27 — End: 2020-12-24

## 2020-05-27 MED ORDER — CETIRIZINE HCL 10 MG PO TABS: 10.0000 mg | ORAL_TABLET | Freq: Every day | ORAL | 1 refills | Status: DC

## 2020-05-27 MED ORDER — BENZONATATE 100 MG PO CAPS: 100.0000 mg | ORAL_CAPSULE | Freq: Three times a day (TID) | ORAL | 0 refills | Status: DC | PRN

## 2020-05-27 MED ORDER — SALINE SPRAY 0.65 % NA SOLN
1.0000 | NASAL | 0 refills | Status: DC | PRN
Start: 2020-05-27 — End: 2020-12-24

## 2020-05-27 NOTE — Assessment & Plan Note (Signed)
>  Take medication as prescribed. >Cool mist humidifier >Guaifenesin for cough and congestion >Zyrtec 10 mg tablet by mouth daily >benzonate 100 mg tablet  for cough. >Education provided, Rx sent to pharmacy. Follow up with worsening or unresolved symptoms

## 2020-05-27 NOTE — Progress Notes (Signed)
   Virtual Visit  Note Due to COVID-19 pandemic this visit was conducted virtually. This visit type was conducted due to national recommendations for restrictions regarding the COVID-19 Pandemic (e.g. social distancing, sheltering in place) in an effort to limit this patient's exposure and mitigate transmission in our community. All issues noted in this document were discussed and addressed.  A physical exam was not performed with this format.  I connected with Amy Stout on 05/27/20 at 10:09 Am by telephone and verified that I am speaking with the correct person using two identifiers. Amy Stout is currently located at home during visit. The provider, Ivy Lynn, NP is located in their office at time of visit.  I discussed the limitations, risks, security and privacy concerns of performing an evaluation and management service by telephone and the availability of in person appointments. I also discussed with the patient that there may be a patient responsible charge related to this service. The patient expressed understanding and agreed to proceed.   History and Present Illness:  URI  This is a new problem. Episode onset: in the past 2 days. The problem has been unchanged. There has been no fever. Associated symptoms include congestion, coughing and headaches. Pertinent negatives include no abdominal pain, ear pain, nausea, rash, sinus pain, sneezing, sore throat or swollen glands. She has tried decongestant for the symptoms. The treatment provided no relief.      Review of Systems  HENT: Positive for congestion. Negative for ear pain, sinus pain, sneezing and sore throat.   Respiratory: Positive for cough.   Gastrointestinal: Negative for abdominal pain and nausea.  Skin: Negative for rash.  Neurological: Positive for headaches.     Observations/Objective: Tele-Visit patient doesn't sound to be in distress  Assessment and Plan: Viral upper respiratory tract infection >Take  medication as prescribed. >Cool mist humidifier >Guaifenesin for cough and congestion >Zyrtec 10 mg tablet by mouth daily >benzonate 100 mg tablet  for cough. >Education provided, Rx sent to pharmacy. Follow up with worsening or unresolved symptoms  Follow Up Instructions:  Follow up with worsening or unresolved symptom   I discussed the assessment and treatment plan with the patient. The patient was provided an opportunity to ask questions and all were answered. The patient agreed with the plan and demonstrated an understanding of the instructions.   The patient was advised to call back or seek an in-person evaluation if the symptoms worsen or if the condition fails to improve as anticipated.  The above assessment and management plan was discussed with the patient. The patient verbalized understanding of and has agreed to the management plan. Patient is aware to call the clinic if symptoms persist or worsen. Patient is aware when to return to the clinic for a follow-up visit. Patient educated on when it is appropriate to go to the emergency department.   Time call ended:  10:19 Am   I provided 10 minutes of  non face-to-face time during this encounter.    Ivy Lynn, NP

## 2020-06-27 ENCOUNTER — Ambulatory Visit: Payer: BC Managed Care – PPO | Admitting: Family

## 2020-07-24 DIAGNOSIS — L821 Other seborrheic keratosis: Secondary | ICD-10-CM | POA: Diagnosis not present

## 2020-08-29 DIAGNOSIS — Z1231 Encounter for screening mammogram for malignant neoplasm of breast: Secondary | ICD-10-CM | POA: Diagnosis not present

## 2020-09-02 ENCOUNTER — Other Ambulatory Visit: Payer: Self-pay

## 2020-09-02 DIAGNOSIS — R928 Other abnormal and inconclusive findings on diagnostic imaging of breast: Secondary | ICD-10-CM

## 2020-09-02 DIAGNOSIS — N6489 Other specified disorders of breast: Secondary | ICD-10-CM

## 2020-09-03 ENCOUNTER — Other Ambulatory Visit: Payer: Self-pay | Admitting: *Deleted

## 2020-09-03 DIAGNOSIS — J069 Acute upper respiratory infection, unspecified: Secondary | ICD-10-CM

## 2020-09-03 MED ORDER — CETIRIZINE HCL 10 MG PO TABS: 10.0000 mg | ORAL_TABLET | Freq: Every day | ORAL | 1 refills | Status: DC

## 2020-09-11 DIAGNOSIS — R922 Inconclusive mammogram: Secondary | ICD-10-CM | POA: Diagnosis not present

## 2020-09-11 DIAGNOSIS — R928 Other abnormal and inconclusive findings on diagnostic imaging of breast: Secondary | ICD-10-CM | POA: Diagnosis not present

## 2020-09-20 DIAGNOSIS — Z01411 Encounter for gynecological examination (general) (routine) with abnormal findings: Secondary | ICD-10-CM | POA: Diagnosis not present

## 2020-09-20 DIAGNOSIS — N959 Unspecified menopausal and perimenopausal disorder: Secondary | ICD-10-CM | POA: Diagnosis not present

## 2020-09-20 DIAGNOSIS — N920 Excessive and frequent menstruation with regular cycle: Secondary | ICD-10-CM | POA: Diagnosis not present

## 2020-09-20 DIAGNOSIS — E049 Nontoxic goiter, unspecified: Secondary | ICD-10-CM | POA: Diagnosis not present

## 2020-09-20 DIAGNOSIS — Z113 Encounter for screening for infections with a predominantly sexual mode of transmission: Secondary | ICD-10-CM | POA: Diagnosis not present

## 2020-09-20 DIAGNOSIS — E041 Nontoxic single thyroid nodule: Secondary | ICD-10-CM | POA: Diagnosis not present

## 2020-09-20 DIAGNOSIS — D34 Benign neoplasm of thyroid gland: Secondary | ICD-10-CM | POA: Diagnosis not present

## 2020-09-20 DIAGNOSIS — Z01419 Encounter for gynecological examination (general) (routine) without abnormal findings: Secondary | ICD-10-CM | POA: Diagnosis not present

## 2020-09-20 DIAGNOSIS — Z7251 High risk heterosexual behavior: Secondary | ICD-10-CM | POA: Diagnosis not present

## 2020-12-17 ENCOUNTER — Ambulatory Visit: Payer: BC Managed Care – PPO | Admitting: Family

## 2020-12-17 ENCOUNTER — Encounter: Payer: BC Managed Care – PPO | Admitting: Family

## 2020-12-24 ENCOUNTER — Encounter: Payer: Self-pay | Admitting: Family

## 2020-12-24 ENCOUNTER — Ambulatory Visit (INDEPENDENT_AMBULATORY_CARE_PROVIDER_SITE_OTHER): Payer: BC Managed Care – PPO | Admitting: Family

## 2020-12-24 VITALS — BP 117/77 | HR 88 | Temp 98.2°F | Ht 66.0 in | Wt 191.6 lb

## 2020-12-24 DIAGNOSIS — Z0001 Encounter for general adult medical examination with abnormal findings: Secondary | ICD-10-CM | POA: Diagnosis not present

## 2020-12-24 DIAGNOSIS — G43009 Migraine without aura, not intractable, without status migrainosus: Secondary | ICD-10-CM | POA: Diagnosis not present

## 2020-12-24 DIAGNOSIS — F411 Generalized anxiety disorder: Secondary | ICD-10-CM

## 2020-12-24 DIAGNOSIS — K2100 Gastro-esophageal reflux disease with esophagitis, without bleeding: Secondary | ICD-10-CM

## 2020-12-24 DIAGNOSIS — Z0000 Encounter for general adult medical examination without abnormal findings: Secondary | ICD-10-CM | POA: Diagnosis not present

## 2020-12-24 DIAGNOSIS — E669 Obesity, unspecified: Secondary | ICD-10-CM

## 2020-12-24 MED ORDER — ESCITALOPRAM OXALATE 5 MG PO TABS: 5.0000 mg | ORAL_TABLET | Freq: Every day | ORAL | 1 refills | Status: DC

## 2020-12-24 MED ORDER — SUMATRIPTAN SUCCINATE 50 MG PO TABS: 50.0000 mg | ORAL_TABLET | ORAL | 4 refills | Status: DC | PRN

## 2020-12-24 MED ORDER — ESOMEPRAZOLE MAGNESIUM 40 MG PO CPDR: 40.0000 mg | DELAYED_RELEASE_CAPSULE | Freq: Every day | ORAL | 3 refills | Status: DC

## 2020-12-24 NOTE — Patient Instructions (Signed)
Migraine Headache A migraine headache is an intense, throbbing pain on one side or both sides of the head. Migraine headaches may also cause other symptoms, such as nausea, vomiting, and sensitivity to light and noise. A migraine headache can last from 4 hours to 3 days. Talk with your doctor about what things may bring on (trigger) your migraine headaches. What are the causes? The exact cause of this condition is not known. However, a migraine may be caused when nerves in the brain become irritated and release chemicals that cause inflammation of blood vessels. This inflammation causes pain. This condition may be triggered or caused by: Drinking alcohol. Smoking. Taking medicines, such as: Medicine used to treat chest pain (nitroglycerin). Birth control pills. Estrogen. Certain blood pressure medicines. Eating or drinking products that contain nitrates, glutamate, aspartame, or tyramine. Aged cheeses, chocolate, or caffeine may also be triggers. Doing physical activity. Other things that may trigger a migraine headache include: Menstruation. Pregnancy. Hunger. Stress. Lack of sleep or too much sleep. Weather changes. Fatigue. What increases the risk? The following factors may make you more likely to experience migraine headaches: Being a certain age. This condition is more common in people who are 25-55 years old. Being female. Having a family history of migraine headaches. Being Caucasian. Having a mental health condition, such as depression or anxiety. Being obese. What are the signs or symptoms? The main symptom of this condition is pulsating or throbbing pain. This pain may: Happen in any area of the head, such as on one side or both sides. Interfere with daily activities. Get worse with physical activity. Get worse with exposure to bright lights or loud noises. Other symptoms may include: Nausea. Vomiting. Dizziness. General sensitivity to bright lights, loud noises, or  smells. Before you get a migraine headache, you may get warning signs (an aura). An aura may include: Seeing flashing lights or having blind spots. Seeing bright spots, halos, or zigzag lines. Having tunnel vision or blurred vision. Having numbness or a tingling feeling. Having trouble talking. Having muscle weakness. Some people have symptoms after a migraine headache (postdromal phase), such as: Feeling tired. Difficulty concentrating. How is this diagnosed? A migraine headache can be diagnosed based on: Your symptoms. A physical exam. Tests, such as: CT scan or an MRI of the head. These imaging tests can help rule out other causes of headaches. Taking fluid from the spine (lumbar puncture) and analyzing it (cerebrospinal fluid analysis, or CSF analysis). How is this treated? This condition may be treated with medicines that: Relieve pain. Relieve nausea. Prevent migraine headaches. Treatment for this condition may also include: Acupuncture. Lifestyle changes like avoiding foods that trigger migraine headaches. Biofeedback. Cognitive behavioral therapy. Follow these instructions at home: Medicines Take over-the-counter and prescription medicines only as told by your health care provider. Ask your health care provider if the medicine prescribed to you: Requires you to avoid driving or using heavy machinery. Can cause constipation. You may need to take these actions to prevent or treat constipation: Drink enough fluid to keep your urine pale yellow. Take over-the-counter or prescription medicines. Eat foods that are high in fiber, such as beans, whole grains, and fresh fruits and vegetables. Limit foods that are high in fat and processed sugars, such as fried or sweet foods. Lifestyle Do not drink alcohol. Do not use any products that contain nicotine or tobacco, such as cigarettes, e-cigarettes, and chewing tobacco. If you need help quitting, ask your health care  provider. Get at least 8   hours of sleep every night. Find ways to manage stress, such as meditation, deep breathing, or yoga. General instructions   Keep a journal to find out what may trigger your migraine headaches. For example, write down: What you eat and drink. How much sleep you get. Any change to your diet or medicines. If you have a migraine headache: Avoid things that make your symptoms worse, such as bright lights. It may help to lie down in a dark, quiet room. Do not drive or use heavy machinery. Ask your health care provider what activities are safe for you while you are experiencing symptoms. Keep all follow-up visits as told by your health care provider. This is important. Contact a health care provider if: You develop symptoms that are different or more severe than your usual migraine headache symptoms. You have more than 15 headache days in one month. Get help right away if: Your migraine headache becomes severe. Your migraine headache lasts longer than 72 hours. You have a fever. You have a stiff neck. You have vision loss. Your muscles feel weak or like you cannot control them. You start to lose your balance often. You have trouble walking. You faint. You have a seizure. Summary A migraine headache is an intense, throbbing pain on one side or both sides of the head. Migraines may also cause other symptoms, such as nausea, vomiting, and sensitivity to light and noise. This condition may be treated with medicines and lifestyle changes. You may also need to avoid certain things that trigger a migraine headache. Keep a journal to find out what may trigger your migraine headaches. Contact your health care provider if you have more than 15 headache days in a month or you develop symptoms that are different or more severe than your usual migraine headache symptoms. This information is not intended to replace advice given to you by your health care provider. Make sure you  discuss any questions you have with your health care provider. Document Revised: 04/15/2018 Document Reviewed: 02/03/2018 Elsevier Patient Education  2022 [employer]  

## 2020-12-24 NOTE — Progress Notes (Signed)
Subjective:    Patient ID: Amy Stout, female    DOB: 1965-10-23, 55 y.o.   MRN: [id number]  Chief Complaint  Patient presents with   Medical Management of Chronic Issues   PT presents to the office today for CPE without pap.  Migraine  This is a chronic problem. The current episode started more than 1 year ago (every few months). The problem occurs intermittently. The problem has been waxing and waning. The pain quality is similar to prior headaches.  Gastroesophageal Reflux She complains of belching and heartburn. This is a chronic problem. The current episode started more than 1 year ago. The problem occurs occasionally. Risk factors include obesity. She has tried a PPI for the symptoms. The treatment provided moderate relief.     Review of Systems  Gastrointestinal:  Positive for heartburn.  All other systems reviewed and are negative.  Family History  Problem Relation Age of Onset   Arthritis Mother    Depression Mother    Hearing loss Mother    Miscarriages / [place] Mother    Alcohol abuse Father    Cancer Father        prostate   Diabetes Father    Heart disease Father    Asthma Maternal Aunt    Asthma Maternal Uncle    Uterine cancer Paternal Grandmother    Stroke Paternal Grandfather    Colon cancer Maternal Uncle    Social History   Socioeconomic History   Marital status: Married    Spouse name: Not on file   Number of children: Not on file   Years of education: Not on file   Highest education level: Not on file  Occupational History   Not on file  Tobacco Use   Smoking status: Never   Smokeless tobacco: Never  Substance and Sexual Activity   Alcohol use: No   Drug use: No   Sexual activity: Not on file  Other Topics Concern   Not on file  Social History Narrative   Not on file   Social Determinants of Health   Financial Resource Strain: Not on file  Food Insecurity: Not on file  Transportation Needs: Not on file  Physical Activity:  Not on file  Stress: Not on file  Social Connections: Not on file       Objective:   Physical Exam Vitals reviewed.  Constitutional:      General: She is not in acute distress.    Appearance: She is well-developed. She is obese.  HENT:     Head: Normocephalic and atraumatic.     Right Ear: Tympanic membrane normal.     Left Ear: Tympanic membrane normal.  Eyes:     Pupils: Pupils are equal, round, and reactive to light.  Neck:     Thyroid: No thyromegaly.  Cardiovascular:     Rate and Rhythm: Normal rate and regular rhythm.     Heart sounds: Normal heart sounds. No murmur heard. Pulmonary:     Effort: Pulmonary effort is normal. No respiratory distress.     Breath sounds: Normal breath sounds. No wheezing.  Abdominal:     General: Bowel sounds are normal. There is no distension.     Palpations: Abdomen is soft.     Tenderness: There is no abdominal tenderness.  Musculoskeletal:        General: No tenderness. Normal range of motion.     Cervical back: Normal range of motion and neck supple.  Skin:  General: Skin is warm and dry.  Neurological:     Mental Status: She is alert and oriented to person, place, and time.     Cranial Nerves: No cranial nerve deficit.     Deep Tendon Reflexes: Reflexes are normal and symmetric.  Psychiatric:        Mood and Affect: Mood is anxious.        Behavior: Behavior normal.        Thought Content: Thought content normal.        Judgment: Judgment normal.      BP 117/77    Pulse 88    Temp 98.2 F (36.8 C) (Temporal)    Ht _0  (1.676 m)    Wt 191 lb 9.6 oz (86.9 kg)    BMI 30.93 kg/m      Assessment & Plan:  YAZLYN WENTZEL comes in today with chief complaint of Medical Management of Chronic Issues   Diagnosis and orders addressed:  1. Gastroesophageal reflux disease with esophagitis, unspecified whether hemorrhage - esomeprazole (NEXIUM) 40 MG capsule; Take 1 capsule (40 mg total) by mouth daily at 12 noon. (Needs to be  seen before next refill)  Dispense: 90 capsule; Refill: 3 - CMP14+EGFR - CBC with Differential/Platelet  2. Migraine without aura and without status migrainosus, not intractable - SUMAtriptan (IMITREX) 50 MG tablet; Take 1 tablet (50 mg total) by mouth every 2 (two) hours as needed for migraine. May repeat in 2 hours if headache persists or recurs.  Dispense: 10 tablet; Refill: 4 - CMP14+EGFR - CBC with Differential/Platelet  3. Annual physical exam - CMP14+EGFR - CBC with Differential/Platelet - Lipid panel - TSH  4. GAD (generalized anxiety disorder) Start Lexapro 5 mg  Stress management  - CMP14+EGFR - CBC with Differential/Platelet - escitalopram (LEXAPRO) 5 MG tablet; Take 1 tablet (5 mg total) by mouth daily.  Dispense: 90 tablet; Refill: 1  5. Obesity (BMI 30-39.9) - CMP14+EGFR - CBC with Differential/Platelet   Labs pending Health Maintenance reviewed Diet and exercise encouraged  Follow up plan: 1 month as a telephone visit for GAD   Evelina Dun, FNP

## 2020-12-25 LAB — CMP14+EGFR
ALT: 23 IU/L (ref 0–32)
AST: 16 IU/L (ref 0–40)
Albumin/Globulin Ratio: 1.8 (ref 1.2–2.2)
Albumin: 4.6 g/dL (ref 3.8–4.9)
Alkaline Phosphatase: 98 IU/L (ref 44–121)
BUN/Creatinine Ratio: 14 (ref 9–23)
BUN: 11 mg/dL (ref 6–24)
Bilirubin Total: 0.5 mg/dL (ref 0.0–1.2)
CO2: 27 mmol/L (ref 20–29)
Calcium: 10 mg/dL (ref 8.7–10.2)
Chloride: 98 mmol/L (ref 96–106)
Creatinine, Ser: 0.81 mg/dL (ref 0.57–1.00)
Globulin, Total: 2.6 g/dL (ref 1.5–4.5)
Glucose: 100 mg/dL — ABNORMAL HIGH (ref 70–99)
Potassium: 4.7 mmol/L (ref 3.5–5.2)
Sodium: 140 mmol/L (ref 134–144)
Total Protein: 7.2 g/dL (ref 6.0–8.5)
eGFR: 85 mL/min/{1.73_m2} (ref >59)

## 2020-12-25 LAB — CBC WITH DIFFERENTIAL/PLATELET
Basophils Absolute: 0 10*3/uL (ref 0.0–0.2)
Basos: 0 %
EOS (ABSOLUTE): 0 10*3/uL (ref 0.0–0.4)
Eos: 1 %
Hematocrit: 44.6 % (ref 34.0–46.6)
Hemoglobin: 14.9 g/dL (ref 11.1–15.9)
Immature Grans (Abs): 0 10*3/uL (ref 0.0–0.1)
Immature Granulocytes: 0 %
Lymphocytes Absolute: 1.8 10*3/uL (ref 0.7–3.1)
Lymphs: 30 %
MCH: 28.7 pg (ref 26.6–33.0)
MCHC: 33.4 g/dL (ref 31.5–35.7)
MCV: 86 fL (ref 79–97)
Monocytes Absolute: 0.4 10*3/uL (ref 0.1–0.9)
Monocytes: 7 %
Neutrophils Absolute: 3.6 10*3/uL (ref 1.4–7.0)
Neutrophils: 62 %
Platelets: 260 10*3/uL (ref 150–450)
RBC: 5.19 x10E6/uL (ref 3.77–5.28)
RDW: 12.3 % (ref 11.7–15.4)
WBC: 5.8 10*3/uL (ref 3.4–10.8)

## 2020-12-25 LAB — LIPID PANEL
Chol/HDL Ratio: 4.1 ratio (ref 0.0–4.4)
Cholesterol, Total: 229 mg/dL — ABNORMAL HIGH (ref 100–199)
HDL: 56 mg/dL (ref >39)
LDL Chol Calc (NIH): 150 mg/dL — ABNORMAL HIGH (ref 0–99)
Triglycerides: 129 mg/dL (ref 0–149)
VLDL Cholesterol Cal: 23 mg/dL (ref 5–40)

## 2020-12-25 LAB — TSH: TSH: 2.24 u[IU]/mL (ref 0.450–4.500)

## 2021-01-24 ENCOUNTER — Telehealth: Payer: BC Managed Care – PPO | Admitting: Family

## 2021-01-28 ENCOUNTER — Encounter: Payer: Self-pay | Admitting: Family

## 2021-01-28 ENCOUNTER — Ambulatory Visit (INDEPENDENT_AMBULATORY_CARE_PROVIDER_SITE_OTHER): Payer: BC Managed Care – PPO | Admitting: Family

## 2021-01-28 DIAGNOSIS — F411 Generalized anxiety disorder: Secondary | ICD-10-CM

## 2021-01-28 NOTE — Progress Notes (Signed)
Virtual Visit  Note Due to COVID-19 pandemic this visit was conducted virtually. This visit type was conducted due to national recommendations for restrictions regarding the COVID-19 Pandemic (e.g. social distancing, sheltering in place) in an effort to limit this patient's exposure and mitigate transmission in our community. All issues noted in this document were discussed and addressed.  A physical exam was not performed with this format.  I connected with Amy Stout on 01/28/21 at 11:58 AM  by telephone and verified that I am speaking with the correct person using two identifiers. Amy Stout is currently located at home and no one is currently with her during visit. The provider, Evelina Dun, FNP is located in their office at time of visit.  I discussed the limitations, risks, security and privacy concerns of performing an evaluation and management service by telephone and the availability of in person appointments. I also discussed with the patient that there may be a patient responsible charge related to this service. The patient expressed understanding and agreed to proceed.  [name], [place] are scheduled for a virtual visit with your provider today.    Just as we do with appointments in the office, we must obtain your consent to participate.  Your consent will be active for this visit and any virtual visit you may have with one of our providers in the next 365 days.    If you have a MyChart account, I can also send a copy of this consent to you electronically.  All virtual visits are billed to your insurance company just like a traditional visit in the office.  As this is a virtual visit, video technology does not allow for your provider to perform a traditional examination.  This may limit your provider's ability to fully assess your condition.  If your provider identifies any concerns that need to be evaluated in person or the need to arrange testing such as labs, EKG, etc, we will make  arrangements to do so.    Although advances in technology are sophisticated, we cannot ensure that it will always work on either your end or our end.  If the connection with a video visit is poor, we may have to switch to a telephone visit.  With either a video or telephone visit, we are not always able to ensure that we have a secure connection.   I need to obtain your verbal consent now.   Are you willing to proceed with your visit today?   Amy Stout has provided verbal consent on 01/28/2021 for a virtual visit (video or telephone).   Evelina Dun, [place] 01/28/2021  12:01 PM    History and Present Illness:  Pt calls the office today today to follow on GAD. She was seen on 12/24/20 and started on Lexapro 5 mg. She reports she has not been taking this because she took two doses and got a headache.  Anxiety Presents for follow-up visit. Symptoms include depressed mood, excessive worry and irritability. Symptoms occur occasionally. The severity of symptoms is mild. The quality of sleep is good.       Review of Systems  Constitutional:  Positive for irritability.  All other systems reviewed and are negative.   Observations/Objective: No SOB or distress noted   Assessment and Plan: 1. GAD (generalized anxiety disorder) Restart Lexapro, but start at 2.5 mg for a few weeks then increase 5 mg Stress management  Follow up if symptoms worsen or do not improve  I discussed the assessment and treatment plan with the patient. The patient was provided an opportunity to ask questions and all were answered. The patient agreed with the plan and demonstrated an understanding of the instructions.   The patient was advised to call back or seek an in-person evaluation if the symptoms worsen or if the condition fails to improve as anticipated.  The above assessment and management plan was discussed with the patient. The patient verbalized understanding of and has agreed to the management  plan. Patient is aware to call the clinic if symptoms persist or worsen. Patient is aware when to return to the clinic for a follow-up visit. Patient educated on when it is appropriate to go to the emergency department.   Time call ended:  12:07 pm  I provided 9 minutes of  non face-to-face time during this encounter.    Evelina Dun, FNP

## 2021-10-30 ENCOUNTER — Ambulatory Visit (INDEPENDENT_AMBULATORY_CARE_PROVIDER_SITE_OTHER): Payer: BC Managed Care – PPO | Admitting: Family

## 2021-10-30 ENCOUNTER — Encounter: Payer: Self-pay | Admitting: Family

## 2021-10-30 VITALS — BP 133/87 | HR 84 | Temp 98.0°F | Ht 66.0 in | Wt 187.8 lb

## 2021-10-30 DIAGNOSIS — R051 Acute cough: Secondary | ICD-10-CM

## 2021-10-30 DIAGNOSIS — M25511 Pain in right shoulder: Secondary | ICD-10-CM

## 2021-10-30 DIAGNOSIS — E669 Obesity, unspecified: Secondary | ICD-10-CM

## 2021-10-30 DIAGNOSIS — Z0000 Encounter for general adult medical examination without abnormal findings: Secondary | ICD-10-CM | POA: Diagnosis not present

## 2021-10-30 DIAGNOSIS — Z0001 Encounter for general adult medical examination with abnormal findings: Secondary | ICD-10-CM

## 2021-10-30 DIAGNOSIS — G43009 Migraine without aura, not intractable, without status migrainosus: Secondary | ICD-10-CM

## 2021-10-30 DIAGNOSIS — F411 Generalized anxiety disorder: Secondary | ICD-10-CM

## 2021-10-30 DIAGNOSIS — K2100 Gastro-esophageal reflux disease with esophagitis, without bleeding: Secondary | ICD-10-CM | POA: Diagnosis not present

## 2021-10-30 MED ORDER — ESOMEPRAZOLE MAGNESIUM 40 MG PO CPDR: 40.0000 mg | DELAYED_RELEASE_CAPSULE | Freq: Every day | ORAL | 2 refills | Status: DC

## 2021-10-30 MED ORDER — DICLOFENAC SODIUM 75 MG PO TBEC: 75.0000 mg | DELAYED_RELEASE_TABLET | Freq: Two times a day (BID) | ORAL | 2 refills | Status: DC

## 2021-10-30 MED ORDER — PROMETHAZINE-DM 6.25-15 MG/5ML PO SYRP: 5.0000 mL | ORAL_SOLUTION | Freq: Three times a day (TID) | ORAL | 0 refills | Status: DC | PRN

## 2021-10-30 MED ORDER — SUMATRIPTAN SUCCINATE 50 MG PO TABS: 50.0000 mg | ORAL_TABLET | ORAL | 4 refills | Status: DC | PRN

## 2021-10-30 NOTE — Patient Instructions (Signed)

## 2021-10-30 NOTE — Progress Notes (Signed)
Subjective:    Patient ID: Amy Stout, female    DOB: [birth year], 56 y.o.   MRN: [id number]  Chief Complaint  Patient presents with   Medical Management of Chronic Issues   PT presents to the office today for CPE without pap.  Cough This is a recurrent problem. The problem has been waxing and waning. The cough is Non-productive. Associated symptoms include heartburn. Pertinent negatives include no chills, ear congestion, fever, hemoptysis, myalgias, postnasal drip or shortness of breath. She has tried rest and OTC cough suppressant for the symptoms. The treatment provided mild relief.  Migraine  This is a chronic problem. The current episode started more than 1 year ago. The problem occurs intermittently. The problem has been waxing and waning. The pain is located in the Right unilateral region. The pain quality is similar to prior headaches. The pain is at a severity of 5/10. The pain is moderate. Associated symptoms include coughing, phonophobia (some times) and photophobia. Pertinent negatives include no fever, numbness or vomiting. She has tried triptans for the symptoms. The treatment provided moderate relief.  Gastroesophageal Reflux She complains of belching, coughing, heartburn and a hoarse voice. This is a chronic problem. The current episode started more than 1 year ago. The problem occurs occasionally. She has tried a PPI for the symptoms. The treatment provided moderate relief.  Anxiety Presents for follow-up visit. Symptoms include excessive worry, irritability and nervous/anxious behavior. Patient reports no shortness of breath. Symptoms occur occasionally. The severity of symptoms is moderate.    Shoulder Pain  The pain is present in the right shoulder. This is a recurrent problem. The current episode started more than 1 month ago. Episode frequency: when working. The quality of the pain is described as aching. The pain is at a severity of 10/10. Pertinent negatives include  no fever, numbness or stiffness. She has tried rest and NSAIDS for the symptoms. The treatment provided mild relief.      Review of Systems  Constitutional:  Positive for irritability. Negative for chills and fever.  HENT:  Positive for hoarse voice. Negative for postnasal drip.   Eyes:  Positive for photophobia.  Respiratory:  Positive for cough. Negative for hemoptysis and shortness of breath.   Gastrointestinal:  Positive for heartburn. Negative for vomiting.  Musculoskeletal:  Negative for myalgias and stiffness.  Neurological:  Negative for numbness.  Psychiatric/Behavioral:  The patient is nervous/anxious.   All other systems reviewed and are negative.  Family History  Problem Relation Age of Onset   Arthritis Mother    Depression Mother    Hearing loss Mother    Miscarriages / [place] Mother    Alcohol abuse Father    Cancer Father        prostate   Diabetes Father    Heart disease Father    Asthma Maternal Aunt    Asthma Maternal Uncle    Uterine cancer Paternal Grandmother    Stroke Paternal Grandfather    Colon cancer Maternal Uncle    Social History   Socioeconomic History   Marital status: Married    Spouse name: Not on file   Number of children: Not on file   Years of education: Not on file   Highest education level: Not on file  Occupational History   Not on file  Tobacco Use   Smoking status: Never   Smokeless tobacco: Never  Substance and Sexual Activity   Alcohol use: No   Drug use: No   Sexual  activity: Not on file  Other Topics Concern   Not on file  Social History Narrative   Not on file   Social Determinants of Health   Financial Resource Strain: Not on file  Food Insecurity: Not on file  Transportation Needs: Not on file  Physical Activity: Not on file  Stress: Not on file  Social Connections: Not on file       Objective:   Physical Exam Vitals reviewed.  Constitutional:      General: She is not in acute distress.     Appearance: She is well-developed.  HENT:     Head: Normocephalic and atraumatic.     Right Ear: Tympanic membrane normal.     Left Ear: Tympanic membrane normal.  Eyes:     Pupils: Pupils are equal, round, and reactive to light.  Neck:     Thyroid: No thyromegaly.  Cardiovascular:     Rate and Rhythm: Normal rate and regular rhythm.     Heart sounds: Normal heart sounds. No murmur heard. Pulmonary:     Effort: Pulmonary effort is normal. No respiratory distress.     Breath sounds: Normal breath sounds. No wheezing.  Abdominal:     General: Bowel sounds are normal. There is no distension.     Palpations: Abdomen is soft.     Tenderness: There is no abdominal tenderness.  Musculoskeletal:        General: No tenderness. Normal range of motion.     Cervical back: Normal range of motion and neck supple.     Comments: Full ROM of shoulder, slight pain with abduction   Skin:    General: Skin is warm and dry.  Neurological:     Mental Status: She is alert and oriented to person, place, and time.     Cranial Nerves: No cranial nerve deficit.     Deep Tendon Reflexes: Reflexes are normal and symmetric.  Psychiatric:        Behavior: Behavior normal.        Thought Content: Thought content normal.        Judgment: Judgment normal.       BP 133/87   Pulse 84   Temp 98 F (36.7 C) (Temporal)   Ht '5\' 6"'  (1.676 m)   Wt 187 lb 12.8 oz (85.2 kg)   SpO2 98%   BMI 30.31 kg/m      Assessment & Plan:   UNIKA NAZARENO comes in today with chief complaint of Medical Management of Chronic Issues   Diagnosis and orders addressed:  1. Gastroesophageal reflux disease with esophagitis, unspecified whether hemorrhage - esomeprazole (NEXIUM) 40 MG capsule; Take 1 capsule (40 mg total) by mouth daily at 12 noon.  Dispense: 90 capsule; Refill: 2 - CMP14+EGFR - CBC with Differential/Platelet  2. GAD (generalized anxiety disorder) - CMP14+EGFR - CBC with Differential/Platelet  3.  Migraine without aura and without status migrainosus, not intractable - SUMAtriptan (IMITREX) 50 MG tablet; Take 1 tablet (50 mg total) by mouth every 2 (two) hours as needed for migraine. May repeat in 2 hours if headache persists or recurs.  Dispense: 10 tablet; Refill: 4 - CMP14+EGFR - CBC with Differential/Platelet  4. Annual physical exam - CMP14+EGFR - CBC with Differential/Platelet - Lipid panel - TSH  5. Obesity (BMI 30-39.9) - CMP14+EGFR - CBC with Differential/Platelet  6. Acute cough - promethazine-dextromethorphan (PROMETHAZINE-DM) 6.25-15 MG/5ML syrup; Take 5 mLs by mouth 3 (three) times daily as needed for cough.  Dispense: 118  mL; Refill: 0 - CMP14+EGFR - CBC with Differential/Platelet   Labs pending Health Maintenance reviewed Diet and exercise encouraged  Follow up plan: 1 year    Evelina Dun, FNP

## 2021-10-31 LAB — LIPID PANEL
Chol/HDL Ratio: 4.3 ratio (ref 0.0–4.4)
Cholesterol, Total: 222 mg/dL — ABNORMAL HIGH (ref 100–199)
HDL: 52 mg/dL
LDL Chol Calc (NIH): 143 mg/dL — ABNORMAL HIGH (ref 0–99)
Triglycerides: 152 mg/dL — ABNORMAL HIGH (ref 0–149)
VLDL Cholesterol Cal: 27 mg/dL (ref 5–40)

## 2021-10-31 LAB — CMP14+EGFR
ALT: 16 IU/L (ref 0–32)
AST: 16 IU/L (ref 0–40)
Albumin/Globulin Ratio: 1.6 (ref 1.2–2.2)
Albumin: 4.5 g/dL (ref 3.8–4.9)
Alkaline Phosphatase: 98 IU/L (ref 44–121)
BUN/Creatinine Ratio: 12 (ref 9–23)
BUN: 11 mg/dL (ref 6–24)
Bilirubin Total: 0.3 mg/dL (ref 0.0–1.2)
CO2: 25 mmol/L (ref 20–29)
Calcium: 9.8 mg/dL (ref 8.7–10.2)
Chloride: 102 mmol/L (ref 96–106)
Creatinine, Ser: 0.89 mg/dL (ref 0.57–1.00)
Globulin, Total: 2.8 g/dL (ref 1.5–4.5)
Glucose: 105 mg/dL — ABNORMAL HIGH (ref 70–99)
Potassium: 4.7 mmol/L (ref 3.5–5.2)
Sodium: 142 mmol/L (ref 134–144)
Total Protein: 7.3 g/dL (ref 6.0–8.5)
eGFR: 76 mL/min/{1.73_m2} (ref >59)

## 2021-10-31 LAB — CBC WITH DIFFERENTIAL/PLATELET
Basophils Absolute: 0 x10E3/uL (ref 0.0–0.2)
Basos: 0 %
EOS (ABSOLUTE): 0.1 x10E3/uL (ref 0.0–0.4)
Eos: 2 %
Hematocrit: 43.7 % (ref 34.0–46.6)
Hemoglobin: 14.1 g/dL (ref 11.1–15.9)
Immature Grans (Abs): 0 x10E3/uL (ref 0.0–0.1)
Immature Granulocytes: 0 %
Lymphocytes Absolute: 1.6 x10E3/uL (ref 0.7–3.1)
Lymphs: 29 %
MCH: 28.4 pg (ref 26.6–33.0)
MCHC: 32.3 g/dL (ref 31.5–35.7)
MCV: 88 fL (ref 79–97)
Monocytes Absolute: 0.4 x10E3/uL (ref 0.1–0.9)
Monocytes: 7 %
Neutrophils Absolute: 3.4 x10E3/uL (ref 1.4–7.0)
Neutrophils: 62 %
Platelets: 253 x10E3/uL (ref 150–450)
RBC: 4.96 x10E6/uL (ref 3.77–5.28)
RDW: 12.5 % (ref 11.7–15.4)
WBC: 5.4 x10E3/uL (ref 3.4–10.8)

## 2021-10-31 LAB — TSH: TSH: 1.89 u[IU]/mL (ref 0.450–4.500)

## 2022-02-26 ENCOUNTER — Ambulatory Visit: Payer: BC Managed Care – PPO | Admitting: Family

## 2022-03-01 ENCOUNTER — Emergency Department (HOSPITAL_COMMUNITY)
Admission: EM | Admit: 2022-03-01 | Discharge: 2022-03-01 | Disposition: A | Discharge status: Home / Self Care | Payer: BC Managed Care – PPO | Attending: Emergency Medicine | Admitting: Emergency Medicine

## 2022-03-01 ENCOUNTER — Encounter (HOSPITAL_COMMUNITY): Payer: Self-pay

## 2022-03-01 ENCOUNTER — Other Ambulatory Visit: Payer: Self-pay

## 2022-03-01 DIAGNOSIS — R519 Headache, unspecified: Secondary | ICD-10-CM | POA: Diagnosis not present

## 2022-03-01 DIAGNOSIS — Z1152 Encounter for screening for COVID-19: Secondary | ICD-10-CM | POA: Insufficient documentation

## 2022-03-01 DIAGNOSIS — J019 Acute sinusitis, unspecified: Secondary | ICD-10-CM | POA: Insufficient documentation

## 2022-03-01 DIAGNOSIS — G43809 Other migraine, not intractable, without status migrainosus: Secondary | ICD-10-CM | POA: Diagnosis not present

## 2022-03-01 DIAGNOSIS — J0110 Acute frontal sinusitis, unspecified: Secondary | ICD-10-CM | POA: Diagnosis not present

## 2022-03-01 LAB — CBC WITH DIFFERENTIAL/PLATELET
Abs Immature Granulocytes: 0.03 10*3/uL (ref 0.00–0.07)
Basophils Absolute: 0 10*3/uL (ref 0.0–0.1)
Basophils Relative: 0 %
Eosinophils Absolute: 0 10*3/uL (ref 0.0–0.5)
Eosinophils Relative: 0 %
HCT: 46.4 % — ABNORMAL HIGH (ref 36.0–46.0)
Hemoglobin: 15.8 g/dL — ABNORMAL HIGH (ref 12.0–15.0)
Immature Granulocytes: 0 %
Lymphocytes Relative: 13 %
Lymphs Abs: 1.1 10*3/uL (ref 0.7–4.0)
MCH: 29.1 pg (ref 26.0–34.0)
MCHC: 34.1 g/dL (ref 30.0–36.0)
MCV: 85.5 fL (ref 80.0–100.0)
Monocytes Absolute: 0.3 10*3/uL (ref 0.1–1.0)
Monocytes Relative: 4 %
Neutro Abs: 7 10*3/uL (ref 1.7–7.7)
Neutrophils Relative %: 83 %
Platelets: 304 10*3/uL (ref 150–400)
RBC: 5.43 MIL/uL — ABNORMAL HIGH (ref 3.87–5.11)
RDW: 12.4 % (ref 11.5–15.5)
WBC: 8.4 10*3/uL (ref 4.0–10.5)
nRBC: 0 % (ref 0.0–0.2)

## 2022-03-01 LAB — RESP PANEL BY RT-PCR (RSV, FLU A&B, COVID)  RVPGX2
Influenza A by PCR: NEGATIVE
Influenza B by PCR: NEGATIVE
Resp Syncytial Virus by PCR: NEGATIVE
SARS Coronavirus 2 by RT PCR: NEGATIVE

## 2022-03-01 LAB — BASIC METABOLIC PANEL
Anion gap: 11 (ref 5–15)
BUN: 10 mg/dL (ref 6–20)
CO2: 28 mmol/L (ref 22–32)
Calcium: 9.8 mg/dL (ref 8.9–10.3)
Chloride: 97 mmol/L — ABNORMAL LOW (ref 98–111)
Creatinine, Ser: 0.74 mg/dL (ref 0.44–1.00)
GFR, Estimated: >60 mL/min (ref >60)
Glucose, Bld: 116 mg/dL — ABNORMAL HIGH (ref 70–99)
Potassium: 3.3 mmol/L — ABNORMAL LOW (ref 3.5–5.1)
Sodium: 136 mmol/L (ref 135–145)

## 2022-03-01 MED ORDER — AMOXICILLIN-POT CLAVULANATE 875-125 MG PO TABS: 1.0000 | ORAL_TABLET | Freq: Two times a day (BID) | ORAL | 0 refills | Status: DC

## 2022-03-01 MED ORDER — DIPHENHYDRAMINE HCL 50 MG/ML IJ SOLN
25.0000 mg | Freq: Once | INTRAMUSCULAR | Status: AC
  Administered 2022-03-01: 25 mg via INTRAVENOUS
  Filled 2022-03-01: qty 1

## 2022-03-01 MED ORDER — SODIUM CHLORIDE 0.9 % IV BOLUS
1000.0000 mL | Freq: Once | INTRAVENOUS | Status: AC
  Administered 2022-03-01: 1000 mL via INTRAVENOUS

## 2022-03-01 MED ORDER — METOCLOPRAMIDE HCL 5 MG/ML IJ SOLN
10.0000 mg | Freq: Once | INTRAMUSCULAR | Status: AC
  Administered 2022-03-01: 10 mg via INTRAVENOUS
  Filled 2022-03-01: qty 2

## 2022-03-01 NOTE — ED Triage Notes (Signed)
Pt c/o headache, N/V X 1 week. Pain 5/10 focused around R eye. Relief with rest.    Taken migraine meds, tylenol for sinus,

## 2022-03-01 NOTE — Discharge Instructions (Signed)
Your blood work today was reassuring, take your home medication for migraines as needed.  Take the Augmentin twice daily for 7 days for sinus infection.  Return to the ED if you have lateralized weakness or numbness, dysarthria, vision changes, new or concerning symptoms.

## 2022-03-01 NOTE — ED Provider Notes (Signed)
[facility] Provider Note   CSN: [id number] Arrival date & time: 03/01/22  1037     History  Chief Complaint  Patient presents with   Headache    Amy Stout is a 57 y.o. female.   Headache    Patient presents due to headache.  It started a week ago, has been intermittent.  She feels it behind her right eye, it is associated with photophobia, nausea and emesis that started yesterday.  She has a history of migraines and this feels similar.  She has tried abortive migraine medicine without any improvement.  States she has been having nasal congestion, purulent nasal discharge for the last week as well.  No chest pain or shortness of breath, denies any fevers at home.  No known neck pain or stiffness.  Home Medications Prior to Admission medications   Medication Sig Start Date End Date Taking? Authorizing Provider  amoxicillin-clavulanate (AUGMENTIN) 875-125 MG tablet Take 1 tablet by mouth every 12 (twelve) hours. 03/01/22  Yes Sherrill Raring, PA-C  diclofenac (VOLTAREN) 75 MG EC tablet Take 1 tablet (75 mg total) by mouth 2 (two) times daily. 10/30/21   Sharion Balloon, FNP  esomeprazole (NEXIUM) 40 MG capsule Take 1 capsule (40 mg total) by mouth daily at 12 noon. 10/30/21   Evelina Dun A, FNP  promethazine-dextromethorphan (PROMETHAZINE-DM) 6.25-15 MG/5ML syrup Take 5 mLs by mouth 3 (three) times daily as needed for cough. 10/30/21   Sharion Balloon, FNP  SUMAtriptan (IMITREX) 50 MG tablet Take 1 tablet (50 mg total) by mouth every 2 (two) hours as needed for migraine. May repeat in 2 hours if headache persists or recurs. 10/30/21   Sharion Balloon, FNP      Allergies    Patient has no known allergies.    Review of Systems   Review of Systems  Neurological:  Positive for headaches.    Physical Exam Updated Vital Signs BP (!) 150/95 (BP Location: Left Arm)   Pulse 77   Resp 18   Ht '5\' 6"'$  (1.676 m)   Wt 81.6 kg   SpO2  100%   BMI 29.05 kg/m  Physical Exam Vitals and nursing note reviewed. Exam conducted with a chaperone present.  Constitutional:      Appearance: Normal appearance.  HENT:     Head: Normocephalic and atraumatic.     Nose: Congestion present.     Comments: Injected nasal turbinates bilaterally Eyes:     General: No scleral icterus.       Right eye: No discharge.        Left eye: No discharge.     Extraocular Movements: Extraocular movements intact.     Pupils: Pupils are equal, round, and reactive to light.  Neck:     Comments: No nuchal rigidity Cardiovascular:     Rate and Rhythm: Normal rate and regular rhythm.     Pulses: Normal pulses.     Heart sounds: Normal heart sounds. No murmur heard.    No friction rub. No gallop.  Pulmonary:     Effort: Pulmonary effort is normal. No respiratory distress.     Breath sounds: Normal breath sounds.  Abdominal:     General: Abdomen is flat. Bowel sounds are normal. There is no distension.     Palpations: Abdomen is soft.     Tenderness: There is no abdominal tenderness.  Skin:    General: Skin is warm and dry.  Coloration: Skin is not jaundiced.  Neurological:     Mental Status: She is alert. Mental status is at baseline.     Coordination: Coordination normal.     Comments: Cranial nerves II to XII grossly intact, upper and lower extremity strength symmetric bilaterally.  Ambulatory with a steady gait.     ED Results / Procedures / Treatments   Labs (all labs ordered are listed, but only abnormal results are displayed) Labs Reviewed  BASIC METABOLIC PANEL - Abnormal; Notable for the following components:      Result Value   Potassium 3.3 (*)    Chloride 97 (*)    Glucose, Bld 116 (*)    All other components within normal limits  CBC WITH DIFFERENTIAL/PLATELET - Abnormal; Notable for the following components:   RBC 5.43 (*)    Hemoglobin 15.8 (*)    HCT 46.4 (*)    All other components within normal limits  RESP PANEL  BY RT-PCR (RSV, FLU A&B, COVID)  RVPGX2    EKG None  Radiology No results found.  Procedures Procedures    Medications Ordered in ED Medications  metoCLOPramide (REGLAN) injection 10 mg (10 mg Intravenous Given 03/01/22 1256)  diphenhydrAMINE (BENADRYL) injection 25 mg (25 mg Intravenous Given 03/01/22 1256)  sodium chloride 0.9 % bolus 1,000 mL (0 mLs Intravenous Stopped 03/01/22 1347)    ED Course/ Medical Decision Making/ A&P                             Medical Decision Making Amount and/or Complexity of Data Reviewed Labs: ordered.  Risk Prescription drug management.   Patient presents to the emergency department due to migraine, this appears consistent with her previous migraine episodes.  She is no fever, no nuchal rigidity and symptoms do not appear consistent with meningitis.  There are no appreciable focal deficits on neuroexam and she is ambulatory with steady gait.  She has slight physical exam findings consistent with a sinusitis, will cover with Augmentin.  Will check basic labs given reported emesis, treat with migraine cocktail and reassess.  No think a CT head is indicated based on normal neuroexam and history of similar presentation. Laboratory workup is unremarkable, will discharge home with Augmentin.  Headache is fully resolved on reevaluation.  Stable for close outpatient follow-up at this time.        Final Clinical Impression(s) / ED Diagnoses Final diagnoses:  Other migraine without status migrainosus, not intractable  Acute sinusitis, recurrence not specified, unspecified location    Rx / DC Orders ED Discharge Orders          Ordered    amoxicillin-clavulanate (AUGMENTIN) 875-125 MG tablet  Every 12 hours        03/01/22 1330              Sherrill Raring, [place] 03/01/22 1657    Milton Ferguson, MD 03/03/22 1535

## 2022-03-03 ENCOUNTER — Encounter: Payer: Self-pay | Admitting: Family

## 2022-03-30 ENCOUNTER — Encounter: Payer: Self-pay | Admitting: Family

## 2022-03-30 ENCOUNTER — Ambulatory Visit (INDEPENDENT_AMBULATORY_CARE_PROVIDER_SITE_OTHER): Payer: BC Managed Care – PPO | Admitting: Family

## 2022-03-30 VITALS — BP 136/86 | HR 78 | Temp 98.0°F | Ht 66.0 in | Wt 174.0 lb

## 2022-03-30 DIAGNOSIS — G8929 Other chronic pain: Secondary | ICD-10-CM

## 2022-03-30 DIAGNOSIS — G43009 Migraine without aura, not intractable, without status migrainosus: Secondary | ICD-10-CM | POA: Diagnosis not present

## 2022-03-30 DIAGNOSIS — M546 Pain in thoracic spine: Secondary | ICD-10-CM

## 2022-03-30 DIAGNOSIS — K219 Gastro-esophageal reflux disease without esophagitis: Secondary | ICD-10-CM | POA: Diagnosis not present

## 2022-03-30 MED ORDER — BACLOFEN 10 MG PO TABS: 10.0000 mg | ORAL_TABLET | Freq: Three times a day (TID) | ORAL | 3 refills | Status: DC

## 2022-03-30 MED ORDER — TOPIRAMATE 50 MG PO TABS: 50.0000 mg | ORAL_TABLET | Freq: Two times a day (BID) | ORAL | 1 refills | Status: DC

## 2022-03-30 MED ORDER — TOPIRAMATE 25 MG PO TABS: ORAL_TABLET | ORAL | 0 refills | Status: DC

## 2022-03-30 NOTE — Progress Notes (Signed)
Subjective:    Patient ID: Amy Stout, female    DOB: [birth year], 57 y.o.   MRN: [id number]  Chief Complaint  Patient presents with   Headache   Gastroesophageal Reflux   Back Pain    Checking on gallbladder    Pt resents to the office today with complaints of back pain with GERD Headache  This is a chronic problem. The current episode started more than 1 year ago. The problem occurs intermittently (10 a month). The pain is located in the Right unilateral region. The pain quality is similar to prior headaches. The quality of the pain is described as aching. Associated symptoms include back pain. She has tried triptans for the symptoms. The treatment provided mild relief. Her past medical history is significant for migraine headaches.  Gastroesophageal Reflux She complains of belching, heartburn and a hoarse voice. This is a chronic problem. The current episode started more than 1 year ago. The problem occurs occasionally. The symptoms are aggravated by certain foods. She has tried a PPI for the symptoms. The treatment provided mild relief.  Back Pain This is a chronic problem. The current episode started more than 1 month ago. The problem occurs intermittently. The pain is present in the thoracic spine and lumbar spine. The quality of the pain is described as aching. The pain is at a severity of 4/10. The pain is mild. Associated symptoms include headaches.      Review of Systems  HENT:  Positive for hoarse voice.   Gastrointestinal:  Positive for heartburn.  Musculoskeletal:  Positive for back pain.  Neurological:  Positive for headaches.  All other systems reviewed and are negative.      Objective:   Physical Exam Vitals reviewed.  Constitutional:      General: She is not in acute distress.    Appearance: She is well-developed. She is obese.  HENT:     Head: Normocephalic and atraumatic.     Right Ear: External ear normal.  Eyes:     Pupils: Pupils are equal, round,  and reactive to light.  Neck:     Thyroid: No thyromegaly.  Cardiovascular:     Rate and Rhythm: Normal rate and regular rhythm.     Heart sounds: Normal heart sounds. No murmur heard. Pulmonary:     Effort: Pulmonary effort is normal. No respiratory distress.     Breath sounds: Normal breath sounds. No wheezing.  Abdominal:     General: Bowel sounds are normal. There is no distension.     Palpations: Abdomen is soft.     Tenderness: There is no abdominal tenderness.  Musculoskeletal:        General: No tenderness. Normal range of motion.     Cervical back: Normal range of motion and neck supple.     Comments: Full ROM of back, right thoracic pain with rotation  Skin:    General: Skin is warm and dry.  Neurological:     Mental Status: She is alert and oriented to person, place, and time.     Cranial Nerves: No cranial nerve deficit.     Deep Tendon Reflexes: Reflexes are normal and symmetric.  Psychiatric:        Behavior: Behavior normal.        Thought Content: Thought content normal.        Judgment: Judgment normal.      BP 136/86   Pulse 78   Temp 98 F (36.7 C) (Temporal)   Ht  5\' 6"  (1.676 m)   Wt 174 lb (78.9 kg)   SpO2 100%   BMI 28.08 kg/m      Assessment & Plan:  [name] comes in today with chief complaint of Headache, Gastroesophageal Reflux, and Back Pain (Checking on gallbladder )   Diagnosis and orders addressed:  1. Chronic right-sided thoracic back pain Rest ROM exercises discussed- handout given Continue diclofenac BID with food  No other NSAID's - baclofen (LIORESAL) 10 MG tablet; Take 1 tablet (10 mg total) by mouth 3 (three) times daily.  Dispense: 60 each; Refill: 3  2. Gastroesophageal reflux disease without esophagitis -Diet discussed- Avoid fried, spicy, citrus foods, caffeine and alcohol -Do not eat 2-3 hours before bedtime -Encouraged small frequent meals -Avoid NSAID's   3. Migraine without aura and without status  migrainosus, not intractable - topiramate (TOPAMAX) 25 MG tablet; Take 1 tablet (25 mg total) by mouth 2 (two) times daily for 14 days, THEN 2 tablets (50 mg total) 2 (two) times daily for 14 days.  Dispense: 84 tablet; Refill: 0 - topiramate (TOPAMAX) 50 MG tablet; Take 1 tablet (50 mg total) by mouth 2 (two) times daily.  Dispense: 180 tablet; Refill: 1  Health Maintenance reviewed- refused  Diet and exercise encouraged  Follow up plan: 6 months    Amy Dun, FNP

## 2022-03-30 NOTE — Patient Instructions (Signed)
Thoracic Strain Rehab Ask your health care provider which exercises are safe for you. Do exercises exactly as told by your provider and adjust them as directed. It is normal to feel mild stretching, pulling, tightness, or discomfort as you do these exercises. Stop right away if you feel sudden pain or your pain gets worse. Do not begin these exercises until told by your provider. Stretching and range-of-motion exercise This exercise warms up your muscles and joints and improves the movement and flexibility of your back and shoulders. This exercise also helps to relieve pain. Chest and spine stretch  Lie down on your back on a firm surface. Roll a towel or a small blanket so it is about 4 inches (10 cm) in diameter. Put the towel under the middle of your back so it is under your spine, but not under your shoulder blades. Put your hands behind your head and let your elbows fall to your sides. This will increase your stretch. Take a deep breath (inhale). Hold for __________ seconds. Relax after you breathe out (exhale). Repeat __________ times. Complete this exercise __________ times a day. Strengthening exercises These exercises build strength and endurance in your back and your shoulder blade muscles. Endurance is the ability to use your muscles for a long time, even after they get tired. Alternating arm and leg raises  Get on your hands and knees on a firm surface. If you are on a hard floor, you may want to use padding, such as an exercise mat, to cushion your knees. Line up your arms and legs. Your hands should be directly below your shoulders, and your knees should be directly below your hips. Lift your left leg behind you. At the same time, raise your right arm and straighten it in front of you. Do not lift your leg higher than your hip. Do not lift your arm higher than your shoulder. Keep your abdominal and back muscles tight. Keep your hips facing the ground. Do not arch your  back. Carefully stay balanced. Do not hold your breath. Hold for __________ seconds. Slowly return to the starting position and repeat with your right leg and your left arm. Repeat __________ times. Complete this exercise __________ times a day. Straight arm rows This exercise is also called the shoulder extension exercise. Stand with your feet shoulder width apart. Secure an exercise band to a stable object in front of you so the band is at or above shoulder height. Hold one end of the exercise band in each hand. Straighten your elbows and lift your hands up to shoulder height. Step back, away from the secured end of the exercise band, until the band stretches. Squeeze your shoulder blades together and pull your hands down to the sides of your thighs. Stop when your hands are straight down by your sides. This is shoulder extension. Do not let your hands go behind your body. Hold for __________ seconds. Slowly return to the starting position. Repeat __________ times. Complete this exercise __________ times a day. Rowing scapular retraction This is an exercise in which the shoulder blades (scapulae) are pulled toward each other (retraction). Sit in a stable chair without armrests, or stand up. Secure an exercise band to a stable object in front of you so the band is at shoulder height. Hold one end of the exercise band in each hand. Your palms should face toward each other. Bring your arms out straight in front of you. Step back, away from the secured end of the  exercise band, until the band stretches. Pull the band backward. As you do this, bend your elbows and squeeze your shoulder blades together, but avoid letting the rest of your body move. Do not shrug your shoulders upward while you do this. Stop when your elbows are at your sides or slightly behind your body. Hold for __________ seconds. Slowly straighten your arms to return to the starting position. Repeat __________ times.  Complete this exercise __________ times a day. Posture and body mechanics Good posture and healthy body mechanics can help to relieve stress in your body's tissues and joints. Body mechanics refers to the movements and positions of your body while you do your daily activities. Posture is part of body mechanics. Good posture means: Your spine is in its natural S-curve position (neutral). Your shoulders are pulled back slightly. Your head is not tipped forward. Follow these guidelines to improve your posture and body mechanics in your everyday activities. Standing  When standing, keep your spine neutral and your feet about hip width apart. Keep a slight bend in your knees. Your ears, shoulders, and hips should line up with each other. When you do a task in which you lean forward while standing in one place for a long time, place one foot up on a stable object that is 2-4 inches (5-10 cm) high, such as a footstool. This helps keep your spine neutral. Sitting  When sitting, keep your spine neutral and keep your feet flat on the floor. Use a footrest if needed. Keep your thighs parallel to the floor. Avoid rounding your shoulders, and avoid tilting your head forward. When working at a desk or a computer, keep your desk at a height where your hands are slightly lower than your elbows. Slide your chair under your desk so you are close enough to maintain good posture. When working at a computer, place your monitor at a height where you are looking straight ahead and you do not have to tilt your head forward or downward to look at the screen. Resting When lying down and resting, avoid positions that are most painful for you. If you have pain with activities such as sitting, bending, stooping, or squatting (flexion-basedactivities), lie in a position in which your body does not bend very much. For example, avoid curling up on your side with your arms and knees near your chest (fetal position). If you have  pain with activities such as standing for a long time or reaching with your arms (extension-basedactivities), lie with your spine in a neutral position and bend your knees slightly. Try the following positions: Lie on your side with a pillow between your knees. Lie on your back with a pillow under your knees.  Lifting  When lifting objects, keep your feet at least shoulder width apart and tighten your abdominal muscles. Bend your knees and hips and keep your spine neutral. It is important to lift using the strength of your legs, not your back. Do not lock your knees straight out. Always ask for help to lift heavy or awkward objects. This information is not intended to replace advice given to you by your health care provider. Make sure you discuss any questions you have with your health care provider. Document Revised: 08/11/2021 Document Reviewed: 08/11/2021 Elsevier Patient Education  [employer].

## 2022-05-03 ENCOUNTER — Other Ambulatory Visit: Payer: Self-pay

## 2022-05-03 ENCOUNTER — Encounter (HOSPITAL_COMMUNITY): Payer: Self-pay

## 2022-05-03 ENCOUNTER — Emergency Department (HOSPITAL_COMMUNITY): Payer: BC Managed Care – PPO

## 2022-05-03 ENCOUNTER — Emergency Department (HOSPITAL_COMMUNITY)
Admission: EM | Admit: 2022-05-03 | Discharge: 2022-05-03 | Disposition: A | Discharge status: Home / Self Care | Payer: BC Managed Care – PPO | Attending: Emergency Medicine | Admitting: Emergency Medicine

## 2022-05-03 DIAGNOSIS — M546 Pain in thoracic spine: Secondary | ICD-10-CM

## 2022-05-03 DIAGNOSIS — R1110 Vomiting, unspecified: Secondary | ICD-10-CM | POA: Diagnosis not present

## 2022-05-03 DIAGNOSIS — R911 Solitary pulmonary nodule: Secondary | ICD-10-CM | POA: Diagnosis not present

## 2022-05-03 DIAGNOSIS — M549 Dorsalgia, unspecified: Secondary | ICD-10-CM | POA: Diagnosis not present

## 2022-05-03 DIAGNOSIS — R9431 Abnormal electrocardiogram [ECG] [EKG]: Secondary | ICD-10-CM | POA: Diagnosis not present

## 2022-05-03 DIAGNOSIS — R079 Chest pain, unspecified: Secondary | ICD-10-CM | POA: Diagnosis not present

## 2022-05-03 LAB — CBC
HCT: 44.3 % (ref 36.0–46.0)
Hemoglobin: 14.5 g/dL (ref 12.0–15.0)
MCH: 28.5 pg (ref 26.0–34.0)
MCHC: 32.7 g/dL (ref 30.0–36.0)
MCV: 87 fL (ref 80.0–100.0)
Platelets: 212 10*3/uL (ref 150–400)
RBC: 5.09 MIL/uL (ref 3.87–5.11)
RDW: 12.6 % (ref 11.5–15.5)
WBC: 7.4 10*3/uL (ref 4.0–10.5)
nRBC: 0 % (ref 0.0–0.2)

## 2022-05-03 LAB — BASIC METABOLIC PANEL
Anion gap: 10 (ref 5–15)
BUN: 12 mg/dL (ref 6–20)
CO2: 25 mmol/L (ref 22–32)
Calcium: 9.4 mg/dL (ref 8.9–10.3)
Chloride: 102 mmol/L (ref 98–111)
Creatinine, Ser: 0.9 mg/dL (ref 0.44–1.00)
GFR, Estimated: >60 mL/min (ref >60)
Glucose, Bld: 126 mg/dL — ABNORMAL HIGH (ref 70–99)
Potassium: 3.4 mmol/L — ABNORMAL LOW (ref 3.5–5.1)
Sodium: 137 mmol/L (ref 135–145)

## 2022-05-03 LAB — TROPONIN I (HIGH SENSITIVITY): Troponin I (High Sensitivity): 3 ng/L (ref <18)

## 2022-05-03 NOTE — ED Notes (Signed)
Discharge instructions discussed with pt. Verbalized understanding. VSS. No questions or concerns regarding discharge  

## 2022-05-03 NOTE — ED Provider Notes (Signed)
[place] [facility] AT [facility] Provider Note   CSN: [id number] Arrival date & time: 05/03/22  2130     History  Chief Complaint  Patient presents with   Back Pain    Amy Stout is a 57 y.o. female with PMHx s/f GERD who presents to ED complaining of right back pain that radiates to RUQ. Pain started 5 hours ago. Patient also endorses non-bloody emesis x2 this morning. Patient states that vomiting felt like it was due to indigestion. No history of abdominal surgery. Denies fever, chills, chest pain, cough, dysuria, hematuria.  mi   Back Pain      Home Medications Prior to Admission medications   Medication Sig Start Date End Date Taking? Authorizing Provider  baclofen (LIORESAL) 10 MG tablet Take 1 tablet (10 mg total) by mouth 3 (three) times daily. 03/30/22   Junie Spencer, FNP  diclofenac (VOLTAREN) 75 MG EC tablet Take 1 tablet (75 mg total) by mouth 2 (two) times daily. 10/30/21   Junie Spencer, FNP  esomeprazole (NEXIUM) 40 MG capsule Take 1 capsule (40 mg total) by mouth daily at 12 noon. 10/30/21   Junie Spencer, FNP  SUMAtriptan (IMITREX) 50 MG tablet Take 1 tablet (50 mg total) by mouth every 2 (two) hours as needed for migraine. May repeat in 2 hours if headache persists or recurs. 10/30/21   Junie Spencer, FNP  topiramate (TOPAMAX) 25 MG tablet Take 1 tablet (25 mg total) by mouth 2 (two) times daily for 14 days, THEN 2 tablets (50 mg total) 2 (two) times daily for 14 days. 03/30/22 04/27/22  Junie Spencer, FNP  topiramate (TOPAMAX) 50 MG tablet Take 1 tablet (50 mg total) by mouth 2 (two) times daily. 04/22/22   Junie Spencer, FNP      Allergies    Patient has no known allergies.    Review of Systems   Review of Systems  Musculoskeletal:  Positive for back pain.    Physical Exam Updated Vital Signs BP (!) 141/83   Pulse 62   Temp 97.6 F (36.4 C) (Oral)   Resp 16   Ht 5\' 6"  (1.676 m)   Wt 78.9 kg   SpO2 100%    BMI 28.08 kg/m  Physical Exam Vitals and nursing note reviewed.  Constitutional:      General: She is not in acute distress.    Appearance: She is not ill-appearing or toxic-appearing.  HENT:     Head: Normocephalic and atraumatic.     Mouth/Throat:     Mouth: Mucous membranes are moist.     Pharynx: No oropharyngeal exudate or posterior oropharyngeal erythema.  Eyes:     General: No scleral icterus.       Right eye: No discharge.        Left eye: No discharge.     Conjunctiva/sclera: Conjunctivae normal.  Cardiovascular:     Rate and Rhythm: Normal rate and regular rhythm.     Pulses: Normal pulses.     Heart sounds: Normal heart sounds. No murmur heard. Pulmonary:     Effort: Pulmonary effort is normal. No respiratory distress.     Breath sounds: Normal breath sounds. No wheezing, rhonchi or rales.  Abdominal:     General: Abdomen is flat. Bowel sounds are normal.     Palpations: Abdomen is soft.     Tenderness: There is no abdominal tenderness.  Musculoskeletal:     Right lower leg: No  edema.     Left lower leg: No edema.  Skin:    General: Skin is warm and dry.     Capillary Refill: Capillary refill takes less than 2 seconds.     Findings: No erythema, lesion or rash.  Neurological:     General: No focal deficit present.     Mental Status: She is alert. Mental status is at baseline.  Psychiatric:        Mood and Affect: Mood normal.        Behavior: Behavior normal.     ED Results / Procedures / Treatments   Labs (all labs ordered are listed, but only abnormal results are displayed) Labs Reviewed  BASIC METABOLIC PANEL - Abnormal; Notable for the following components:      Result Value   Potassium 3.4 (*)    Glucose, Bld 126 (*)    All other components within normal limits  CBC  URINALYSIS, ROUTINE W REFLEX MICROSCOPIC    EKG None  Radiology No results found.  Procedures Procedures    Medications Ordered in ED Medications - No data to  display  ED Course/ Medical Decision Making/ A&P                             Medical Decision Making Amount and/or Complexity of Data Reviewed Labs: ordered.    This patient presents to the ED for concern of right back pain that radiates to right flank, this involves an extensive number of treatment options, and is a complaint that carries with it a high risk of complications and morbidity.  The differential diagnosis includes ACS, PNA, cholecystitis/cholelithiasis, nephrolithiasis, UTI, pyleonephritis   Co morbidities that complicate the patient evaluation  none   Additional history obtained:  none   Lab Tests:  I Ordered, and personally interpreted labs.  The pertinent results include:   CBC: No concern for anemia or leukocytosis CMP: no concern for electrolyte abnormality; no concern for kidney/liver damage UA: no concern for infection Troponin: within normal limits    Imaging Studies ordered:  I ordered imaging studies including Chest Xray: - No acute cardiopulmonary abnormalities. Irregular, nonspecific nodular density overlying the left upper lobe measures 1.3 cm. Consider further evaluation with CT of the chest. I independently visualized and interpreted imaging Patient was educated on findings and told to follow up with outpatient imaging I agree with the radiologist interpretation   Cardiac Monitoring: / EKG:  The patient was maintained on a cardiac monitor.  I personally viewed and interpreted the cardiac monitored which showed an underlying rhythm of: normal sinus rhythm without acute ST changes   Problem List / ED Course / Critical interventions / Medication management  Patient presented for back pain radiating to RUQ. On exam patient was not tender to palpation. Murphy sign negative. No rashes or skin changes noted. Physical exam unremarkable. Incidental finding on chest xray was shared with patient. Blood work and EKG reassuring. Patient afebrile and  stable vitals. I shared with patient that I do not think she needs inpatient treatment at this time. Patient agreed stating that she is ready to go home and will follow up with PCP. I have reviewed the patients home medicines and have made adjustments as needed Patient was given return precautions. Patient stable for discharge at this time.  Patient verbalized understanding of plan.  DDX: these are considered less likely due to history of present illness and physical exam -PNA: Lungs  CTABL and chest xray without concern  -cholecystitis/cholelithiasis: negative murphys signs, no abdominal tenderness, afebrile with stable vitals -nephrolithiasis/UTI/pyleonephritis: pain does not radiate downwards; patient denies urinary symptoms; no history of kidney stone. -ACS: EKG and initial troponin reassuring   Social Determinants of Health:  none            Final Clinical Impression(s) / ED Diagnoses Final diagnoses:  None    Rx / DC Orders ED Discharge Orders     None         Dorthy Cooler, [place] 05/03/22 0933    Eber Hong, MD 05/05/22 [phone]

## 2022-05-03 NOTE — Discharge Instructions (Addendum)
It was a pleasure taking care of you today. Chest xray showed "Irregular, nonspecific nodular density overlying the left upper lobe measures 1.3 cm". Please follow up with primary care for outpatient imaging. Blood work and EKG was reassuring. As discussed, Tylenol and Ibuprofen should help with acute pain relief.  Alternating between 650 mg Tylenol and 400 mg Advil: The best way to alternate taking Acetaminophen (example Tylenol) and Ibuprofen (example Advil/Motrin) is to take them 3 hours apart. For example, if you take ibuprofen at 6 am you can then take Tylenol at 9 am. You can continue this regimen throughout the day, making sure you do not exceed the recommended maximum dose for each drug.  Seek emergency care if experiencing new or worsening symptoms.

## 2022-05-03 NOTE — ED Triage Notes (Signed)
Pt c/o pain in right upper back that radiates to right flank. Pt states the pain woke her up. Pt states she was belching this morning and vomited two times this morning. Pt denies urinary issues.

## 2022-06-02 ENCOUNTER — Telehealth: Payer: Self-pay | Admitting: Family

## 2022-06-02 DIAGNOSIS — Z0289 Encounter for other administrative examinations: Secondary | ICD-10-CM

## 2022-06-02 NOTE — Telephone Encounter (Signed)
Amy Stout dropped off FMLA forms to be completed and signed.  Form Fee Paid? (Y/N)       YES     If NO, form is placed on front office manager desk to hold until payment received. If YES, then form will be placed in the RX/HH Nurse Coordinators box for completion.  Form will not be processed until payment is received

## 2022-06-03 NOTE — Telephone Encounter (Signed)
Information completed and forwarded to PCP 

## 2022-06-05 NOTE — Telephone Encounter (Signed)
PCP completed and signed FMLA forms. They have been faxed to [employer] at fax number [phone]. Patient has been contacted and informed they are complete.

## 2022-07-16 ENCOUNTER — Emergency Department (HOSPITAL_COMMUNITY): Payer: BC Managed Care – PPO

## 2022-07-16 ENCOUNTER — Encounter (HOSPITAL_COMMUNITY): Payer: Self-pay

## 2022-07-16 ENCOUNTER — Other Ambulatory Visit: Payer: Self-pay

## 2022-07-16 ENCOUNTER — Inpatient Hospital Stay (HOSPITAL_COMMUNITY)
Admission: EM | Admit: 2022-07-16 | Discharge: 2022-07-20 | DRG: 419 | Disposition: A | Discharge status: Home / Self Care | Payer: BC Managed Care – PPO

## 2022-07-16 DIAGNOSIS — R932 Abnormal findings on diagnostic imaging of liver and biliary tract: Secondary | ICD-10-CM | POA: Diagnosis not present

## 2022-07-16 DIAGNOSIS — R933 Abnormal findings on diagnostic imaging of other parts of digestive tract: Secondary | ICD-10-CM | POA: Diagnosis not present

## 2022-07-16 DIAGNOSIS — K8010 Calculus of gallbladder with chronic cholecystitis without obstruction: Secondary | ICD-10-CM | POA: Diagnosis not present

## 2022-07-16 DIAGNOSIS — R9431 Abnormal electrocardiogram [ECG] [EKG]: Secondary | ICD-10-CM | POA: Diagnosis not present

## 2022-07-16 DIAGNOSIS — K8060 Calculus of gallbladder and bile duct with cholecystitis, unspecified, without obstruction: Secondary | ICD-10-CM | POA: Diagnosis not present

## 2022-07-16 DIAGNOSIS — K8012 Calculus of gallbladder with acute and chronic cholecystitis without obstruction: Principal | ICD-10-CM | POA: Diagnosis present

## 2022-07-16 DIAGNOSIS — K828 Other specified diseases of gallbladder: Secondary | ICD-10-CM | POA: Diagnosis not present

## 2022-07-16 DIAGNOSIS — R17 Unspecified jaundice: Secondary | ICD-10-CM | POA: Diagnosis not present

## 2022-07-16 DIAGNOSIS — Z79899 Other long term (current) drug therapy: Secondary | ICD-10-CM

## 2022-07-16 DIAGNOSIS — R1013 Epigastric pain: Secondary | ICD-10-CM | POA: Diagnosis not present

## 2022-07-16 DIAGNOSIS — Z9689 Presence of other specified functional implants: Secondary | ICD-10-CM | POA: Diagnosis not present

## 2022-07-16 DIAGNOSIS — F419 Anxiety disorder, unspecified: Secondary | ICD-10-CM | POA: Diagnosis not present

## 2022-07-16 DIAGNOSIS — K8000 Calculus of gallbladder with acute cholecystitis without obstruction: Secondary | ICD-10-CM | POA: Diagnosis not present

## 2022-07-16 DIAGNOSIS — J9811 Atelectasis: Secondary | ICD-10-CM | POA: Diagnosis not present

## 2022-07-16 DIAGNOSIS — K8020 Calculus of gallbladder without cholecystitis without obstruction: Secondary | ICD-10-CM | POA: Diagnosis not present

## 2022-07-16 DIAGNOSIS — R7989 Other specified abnormal findings of blood chemistry: Secondary | ICD-10-CM | POA: Diagnosis not present

## 2022-07-16 DIAGNOSIS — K838 Other specified diseases of biliary tract: Secondary | ICD-10-CM | POA: Diagnosis not present

## 2022-07-16 DIAGNOSIS — K219 Gastro-esophageal reflux disease without esophagitis: Secondary | ICD-10-CM | POA: Diagnosis not present

## 2022-07-16 DIAGNOSIS — R748 Abnormal levels of other serum enzymes: Secondary | ICD-10-CM

## 2022-07-16 DIAGNOSIS — K8050 Calculus of bile duct without cholangitis or cholecystitis without obstruction: Secondary | ICD-10-CM | POA: Diagnosis not present

## 2022-07-16 DIAGNOSIS — R109 Unspecified abdominal pain: Secondary | ICD-10-CM | POA: Diagnosis not present

## 2022-07-16 DIAGNOSIS — Z9889 Other specified postprocedural states: Secondary | ICD-10-CM | POA: Diagnosis not present

## 2022-07-16 LAB — COMPREHENSIVE METABOLIC PANEL
ALT: 248 U/L — ABNORMAL HIGH (ref 0–44)
AST: 449 U/L — ABNORMAL HIGH (ref 15–41)
Albumin: 3.9 g/dL (ref 3.5–5.0)
Alkaline Phosphatase: 290 U/L — ABNORMAL HIGH (ref 38–126)
Anion gap: 14 (ref 5–15)
BUN: 6 mg/dL (ref 6–20)
CO2: 22 mmol/L (ref 22–32)
Calcium: 9.6 mg/dL (ref 8.9–10.3)
Chloride: 100 mmol/L (ref 98–111)
Creatinine, Ser: 0.88 mg/dL (ref 0.44–1.00)
GFR, Estimated: >60 mL/min (ref >60)
Glucose, Bld: 137 mg/dL — ABNORMAL HIGH (ref 70–99)
Potassium: 3.6 mmol/L (ref 3.5–5.1)
Sodium: 136 mmol/L (ref 135–145)
Total Bilirubin: 2 mg/dL — ABNORMAL HIGH (ref 0.3–1.2)
Total Protein: 7 g/dL (ref 6.5–8.1)

## 2022-07-16 LAB — CBC
HCT: 41 % (ref 36.0–46.0)
Hemoglobin: 13.7 g/dL (ref 12.0–15.0)
MCH: 28.3 pg (ref 26.0–34.0)
MCHC: 33.4 g/dL (ref 30.0–36.0)
MCV: 84.7 fL (ref 80.0–100.0)
Platelets: 226 10*3/uL (ref 150–400)
RBC: 4.84 MIL/uL (ref 3.87–5.11)
RDW: 12 % (ref 11.5–15.5)
WBC: 7.8 10*3/uL (ref 4.0–10.5)
nRBC: 0 % (ref 0.0–0.2)

## 2022-07-16 LAB — URINALYSIS, ROUTINE W REFLEX MICROSCOPIC
Bilirubin Urine: NEGATIVE
Glucose, UA: NEGATIVE mg/dL
Hgb urine dipstick: NEGATIVE
Ketones, ur: NEGATIVE mg/dL
Nitrite: NEGATIVE
Protein, ur: NEGATIVE mg/dL
Specific Gravity, Urine: 1.006 (ref 1.005–1.030)
pH: 9 — ABNORMAL HIGH (ref 5.0–8.0)

## 2022-07-16 LAB — LIPASE, BLOOD: Lipase: 31 U/L (ref 11–51)

## 2022-07-16 LAB — HCG, SERUM, QUALITATIVE: Preg, Serum: NEGATIVE

## 2022-07-16 LAB — TROPONIN I (HIGH SENSITIVITY): Troponin I (High Sensitivity): 4 ng/L (ref <18)

## 2022-07-16 LAB — HIV ANTIBODY (ROUTINE TESTING W REFLEX): HIV Screen 4th Generation wRfx: NONREACTIVE

## 2022-07-16 MED ORDER — LACTATED RINGERS IV SOLN: INTRAVENOUS | Status: DC

## 2022-07-16 MED ORDER — ACETAMINOPHEN 650 MG RE SUPP: 650.0000 mg | Freq: Four times a day (QID) | RECTAL | Status: DC | PRN

## 2022-07-16 MED ORDER — METOPROLOL TARTRATE 5 MG/5ML IV SOLN: 5.0000 mg | Freq: Four times a day (QID) | INTRAVENOUS | Status: DC | PRN

## 2022-07-16 MED ORDER — ONDANSETRON 4 MG PO TBDP
4.0000 mg | ORAL_TABLET | Freq: Four times a day (QID) | ORAL | Status: DC | PRN
  Administered 2022-07-16: 4 mg via ORAL
  Filled 2022-07-16: qty 1

## 2022-07-16 MED ORDER — ACETAMINOPHEN 325 MG PO TABS: 650.0000 mg | ORAL_TABLET | Freq: Four times a day (QID) | ORAL | Status: DC | PRN

## 2022-07-16 MED ORDER — DIPHENHYDRAMINE HCL 50 MG/ML IJ SOLN: 25.0000 mg | Freq: Four times a day (QID) | INTRAMUSCULAR | Status: DC | PRN

## 2022-07-16 MED ORDER — SODIUM CHLORIDE 0.9 % IV SOLN
2.0000 g | INTRAVENOUS | Status: DC
  Administered 2022-07-16 – 2022-07-20 (×5): 2 g via INTRAVENOUS
  Filled 2022-07-16 (×5): qty 20

## 2022-07-16 MED ORDER — DIPHENHYDRAMINE HCL 25 MG PO CAPS: 25.0000 mg | ORAL_CAPSULE | Freq: Four times a day (QID) | ORAL | Status: DC | PRN

## 2022-07-16 MED ORDER — SODIUM CHLORIDE 0.9 % IV SOLN: INTRAVENOUS | Status: DC

## 2022-07-16 MED ORDER — HYDRALAZINE HCL 20 MG/ML IJ SOLN: 10.0000 mg | INTRAMUSCULAR | Status: DC | PRN

## 2022-07-16 MED ORDER — FENTANYL CITRATE PF 50 MCG/ML IJ SOSY
50.0000 ug | PREFILLED_SYRINGE | Freq: Once | INTRAMUSCULAR | Status: AC
  Administered 2022-07-16: 50 ug via INTRAVENOUS
  Filled 2022-07-16: qty 1

## 2022-07-16 MED ORDER — ENOXAPARIN SODIUM 40 MG/0.4ML IJ SOSY
40.0000 mg | PREFILLED_SYRINGE | INTRAMUSCULAR | Status: DC
  Administered 2022-07-18 – 2022-07-20 (×2): 40 mg via SUBCUTANEOUS
  Filled 2022-07-16 (×3): qty 0.4

## 2022-07-16 MED ORDER — ONDANSETRON HCL 4 MG/2ML IJ SOLN
4.0000 mg | Freq: Four times a day (QID) | INTRAMUSCULAR | Status: DC | PRN
  Administered 2022-07-19: 4 mg via INTRAVENOUS
  Filled 2022-07-16: qty 2

## 2022-07-16 MED ORDER — LACTATED RINGERS IV BOLUS
1000.0000 mL | Freq: Once | INTRAVENOUS | Status: AC
  Administered 2022-07-16: 1000 mL via INTRAVENOUS

## 2022-07-16 MED ORDER — DOCUSATE SODIUM 100 MG PO CAPS
100.0000 mg | ORAL_CAPSULE | Freq: Two times a day (BID) | ORAL | Status: DC
  Administered 2022-07-17 – 2022-07-20 (×6): 100 mg via ORAL
  Filled 2022-07-16 (×8): qty 1

## 2022-07-16 MED ORDER — OXYCODONE HCL 5 MG PO TABS
5.0000 mg | ORAL_TABLET | ORAL | Status: DC | PRN
  Administered 2022-07-16: 10 mg via ORAL
  Filled 2022-07-16: qty 2

## 2022-07-16 NOTE — ED Triage Notes (Signed)
Pt arrives to ED c/o epigastric abdominal pain that radiates to mid back. Pt endorses n/v. Pt states that it feels like Heart burn. Pt has been dealing with same issues since April. Pain is intermittent

## 2022-07-16 NOTE — ED Notes (Signed)
ED TO INPATIENT HANDOFF REPORT  ED Nurse Name and Phone #: Shelbylynn Walczyk [phone]  S Name/Age/Gender Amy Stout 57 y.o. female Room/Bed: H021C/H021C  Code Status   Code Status: Full Code  Home/SNF/Other Home Patient oriented to: self, place, time, and situation Is this baseline? Yes   Triage Complete: Triage complete  Chief Complaint Cholecystitis with cholelithiasis [K80.10]  Triage Note Pt arrives to ED c/o epigastric abdominal pain that radiates to mid back. Pt endorses n/v. Pt states that it feels like Heart burn. Pt has been dealing with same issues since April. Pain is intermittent    Allergies No Known Allergies  Level of Care/Admitting Diagnosis ED Disposition     ED Disposition  Admit   Condition  --   Comment  Hospital Area: [facility] [[id number]]  Level of Care: Med-Surg [16]  May place patient in observation at [facility] or [facility] if equivalent level of care is available:: No  Covid Evaluation: Asymptomatic - no recent exposure (last 10 days) testing not required  Diagnosis: Cholecystitis with cholelithiasis [454098]  Admitting Physician: CCS, MD [[id number]]  Attending Physician: CCS, MD [[id number]]          B Medical/Surgery History Past Medical History:  Diagnosis Date   Anxiety    GERD (gastroesophageal reflux disease)    Migraines    Past Surgical History:  Procedure Laterality Date   COLONOSCOPY WITH PROPOFOL N/A 04/18/2020   Procedure: COLONOSCOPY WITH PROPOFOL;  Surgeon: Corbin Ade, MD;  [facility];  Service: Endoscopy;  Laterality: N/A;  AM   none     POLYPECTOMY  04/18/2020   Procedure: POLYPECTOMY;  Surgeon: Corbin Ade, MD;  Location: [facility];  Service: Endoscopy;;     A IV Location/Drains/Wounds Patient Lines/Drains/Airways Status     Active Line/Drains/Airways     Name Placement date Placement time Site Days   Peripheral IV 07/16/22 20 G Anterior;Left;Proximal Forearm 07/16/22  0426   Forearm  less than 1            Intake/Output Last 24 hours  Intake/Output Summary (Last 24 hours) at 07/16/2022 0907 Last data filed at 07/16/2022 1191 Gross per 24 hour  Intake 999 ml  Output --  Net 999 ml    Labs/Imaging Results for orders placed or performed during the hospital encounter of 07/16/22 (from the past 48 hour(s))  Urinalysis, Routine w reflex microscopic -Urine, Clean Catch     Status: Abnormal   Collection Time: 07/16/22  2:39 AM  Result Value Ref Range   Color, Urine YELLOW YELLOW   APPearance CLEAR CLEAR   Specific Gravity, Urine 1.006 1.005 - 1.030   pH 9.0 (H) 5.0 - 8.0   Glucose, UA NEGATIVE NEGATIVE mg/dL   Hgb urine dipstick NEGATIVE NEGATIVE   Bilirubin Urine NEGATIVE NEGATIVE   Ketones, ur NEGATIVE NEGATIVE mg/dL   Protein, ur NEGATIVE NEGATIVE mg/dL   Nitrite NEGATIVE NEGATIVE   Leukocytes,Ua TRACE (A) NEGATIVE   RBC / HPF 0-5 0 - 5 RBC/hpf   WBC, UA 0-5 0 - 5 WBC/hpf   Bacteria, UA RARE (A) NONE SEEN   Squamous Epithelial / HPF 0-5 0 - 5 /HPF   Mucus PRESENT     Comment: Performed at [facility], [address], [place], [place] [address]  Lipase, blood     Status: None   Collection Time: 07/16/22  2:52 AM  Result Value Ref Range   Lipase 31 11 -  51 U/L    Comment: Performed at [facility], [address] [place], [place] [address]  Comprehensive metabolic panel     Status: Abnormal   Collection Time: 07/16/22  2:52 AM  Result Value Ref Range   Sodium 136 135 - 145 mmol/L   Potassium 3.6 3.5 - 5.1 mmol/L   Chloride 100 98 - 111 mmol/L   CO2 22 22 - 32 mmol/L   Glucose, Bld 137 (H) 70 - 99 mg/dL    Comment: Glucose reference range applies only to samples taken after fasting for at least 8 hours.   BUN 6 6 - 20 mg/dL   Creatinine, Ser 0.98 0.44 - 1.00 mg/dL   Calcium 9.6 8.9 - 11.9 mg/dL   Total Protein 7.0 6.5 - 8.1 g/dL   Albumin 3.9 3.5 - 5.0 g/dL   AST 147 (H) 15 - 41 U/L   ALT 248 (H) 0 - 44 U/L   Alkaline  Phosphatase 290 (H) 38 - 126 U/L   Total Bilirubin 2.0 (H) 0.3 - 1.2 mg/dL   GFR, Estimated >82 >95 mL/min    Comment: (NOTE) Calculated using the CKD-EPI Creatinine Equation (2021)    Anion gap 14 5 - 15    Comment: Performed at [facility], [address] [place], [place] [address]  CBC     Status: None   Collection Time: 07/16/22  2:52 AM  Result Value Ref Range   WBC 7.8 4.0 - 10.5 K/uL   RBC 4.84 3.87 - 5.11 MIL/uL   Hemoglobin 13.7 12.0 - 15.0 g/dL   HCT 86.5 78.4 - 69.6 %   MCV 84.7 80.0 - 100.0 fL   MCH 28.3 26.0 - 34.0 pg   MCHC 33.4 30.0 - 36.0 g/dL   RDW 29.5 28.4 - 13.2 %   Platelets 226 150 - 400 K/uL   nRBC 0.0 0.0 - 0.2 %    Comment: Performed at [facility], [address] [place], [place] [address]  hCG, serum, qualitative     Status: None   Collection Time: 07/16/22  2:52 AM  Result Value Ref Range   Preg, Serum NEGATIVE NEGATIVE    Comment: Performed at [facility], [address], [place], [place] [address]  Troponin I (High Sensitivity)     Status: None   Collection Time: 07/16/22  2:52 AM  Result Value Ref Range   Troponin I (High Sensitivity) 4 <18 ng/L    Comment: (NOTE) Elevated high sensitivity troponin I (hsTnI) values and significant  changes across serial measurements may suggest ACS but many other  chronic and acute conditions are known to elevate hsTnI results.  Refer to the "Links" section for chest pain algorithms and additional  guidance. Performed at [facility], [address] [place], [place] [address]    US Abdomen Limited RUQ (LIVER/GB)  Result Date: 07/16/2022 CLINICAL DATA:  [id number] with abdominal pain. EXAM: ULTRASOUND ABDOMEN LIMITED RIGHT UPPER QUADRANT COMPARISON:  None Available. FINDINGS: Gallbladder: There is no elicited right upper abdominal tenderness on exam. The gallbladder however, is filled with sludge and small shadowing stones and there is free wall thickening to 5 mm. Common bile duct:  Diameter: 4.8 mm with no intrahepatic biliary prominence Liver: No focal lesion identified. Within normal limits in parenchymal echogenicity. Portal vein is patent on color Doppler imaging with normal direction of blood flow towards the liver. Other: None. IMPRESSION: 1. Gallbladder is filled with sludge  and small shadowing stones and there is free wall thickening to 5 mm. No elicited right upper abdominal tenderness on exam. The findings may suggest pain medicated acute cholecystitis, chronic cholecystitis, or reactive or congestive wall thickening. 2. No biliary dilatation. Electronically Signed   By: Almira Bar M.D.   On: 07/16/2022 04:48    Pending Labs Unresulted Labs (From admission, onward)     Start     Ordered   07/23/22 0500  Creatinine, serum  (enoxaparin (LOVENOX)    CrCl >/= 30 ml/min)  Weekly,   R     Comments: while on enoxaparin therapy    07/16/22 0804   07/17/22 0500  Comprehensive metabolic panel  Tomorrow morning,   R        07/16/22 0826   07/17/22 0500  CBC  Tomorrow morning,   R        07/16/22 0826   07/16/22 0803  HIV Antibody (routine testing w rflx)  (HIV Antibody (Routine testing w reflex) panel)  Once,   R        07/16/22 0804            Vitals/Pain Today's Vitals   07/16/22 0213 07/16/22 0233 07/16/22 0618  BP: 127/78  (!) 133/97  Pulse: 66  67  Resp: 18  16  Temp: 98.3 F (36.8 C)  97.9 F (36.6 C)  TempSrc: Oral  Oral  SpO2: 100%  100%  Weight:  76.2 kg   Height:  5\' 6"  (1.676 m)   PainSc:  6      Isolation Precautions No active isolations  Medications Medications  lactated ringers infusion ( Intravenous Rate/Dose Verify 07/16/22 0738)  enoxaparin (LOVENOX) injection 40 mg (40 mg Subcutaneous Not Given 07/16/22 0833)  0.9 %  sodium chloride infusion (0 mLs Intravenous Hold 07/16/22 0833)  cefTRIAXone (ROCEPHIN) 2 g in sodium chloride 0.9 % 100 mL IVPB (0 g Intravenous Stopped 07/16/22 0904)  acetaminophen (TYLENOL) tablet 650 mg (has no  administration in time range)    Or  acetaminophen (TYLENOL) suppository 650 mg (has no administration in time range)  oxyCODONE (Oxy IR/ROXICODONE) immediate release tablet 5-10 mg (has no administration in time range)  diphenhydrAMINE (BENADRYL) capsule 25 mg (has no administration in time range)    Or  diphenhydrAMINE (BENADRYL) injection 25 mg (has no administration in time range)  ondansetron (ZOFRAN-ODT) disintegrating tablet 4 mg (has no administration in time range)    Or  ondansetron (ZOFRAN) injection 4 mg (has no administration in time range)  metoprolol tartrate (LOPRESSOR) injection 5 mg (has no administration in time range)  hydrALAZINE (APRESOLINE) injection 10 mg (has no administration in time range)  docusate sodium (COLACE) capsule 100 mg (has no administration in time range)  fentaNYL (SUBLIMAZE) injection 50 mcg (50 mcg Intravenous Given 07/16/22 0434)  lactated ringers bolus 1,000 mL (0 mLs Intravenous Stopped 07/16/22 0612)    Mobility walks     Focused Assessments    R Recommendations: See Admitting Provider Note  Report given to:   Additional Notes:

## 2022-07-16 NOTE — ED Provider Notes (Signed)
[place] EMERGENCY DEPARTMENT AT [facility] Provider Note   CSN: [id number] Arrival date & time: 07/16/22  0208     History  Chief Complaint  Patient presents with   Abdominal Pain   Back Pain    Amy Stout is a 57 y.o. female.  57 year old female who presents ER today with right upper quadrant pain.  She states that she has right shoulder pain as well.  States that the pain seems to be in her right shoulder blade and then also in her right flank that wraps around to her right abdomen.  Been going on for 2 to 3 hours.  She had some emesis with it.  She states has happened multiple times recently but usually gets better.  She is on omeprazole at home.  She has had cardiac workups that were negative.  She still has her gallbladder and has never had that evaluated as far she knows.   Abdominal Pain Back Pain Associated symptoms: abdominal pain        Home Medications Prior to Admission medications   Medication Sig Start Date End Date Taking? Authorizing Provider  baclofen (LIORESAL) 10 MG tablet Take 1 tablet (10 mg total) by mouth 3 (three) times daily. 03/30/22   Junie Spencer, FNP  diclofenac (VOLTAREN) 75 MG EC tablet Take 1 tablet (75 mg total) by mouth 2 (two) times daily. 10/30/21   Junie Spencer, FNP  esomeprazole (NEXIUM) 40 MG capsule Take 1 capsule (40 mg total) by mouth daily at 12 noon. 10/30/21   Junie Spencer, FNP  SUMAtriptan (IMITREX) 50 MG tablet Take 1 tablet (50 mg total) by mouth every 2 (two) hours as needed for migraine. May repeat in 2 hours if headache persists or recurs. 10/30/21   Junie Spencer, FNP  topiramate (TOPAMAX) 25 MG tablet Take 1 tablet (25 mg total) by mouth 2 (two) times daily for 14 days, THEN 2 tablets (50 mg total) 2 (two) times daily for 14 days. 03/30/22 04/27/22  Junie Spencer, FNP  topiramate (TOPAMAX) 50 MG tablet Take 1 tablet (50 mg total) by mouth 2 (two) times daily. 04/22/22   Junie Spencer, FNP       Allergies    Patient has no known allergies.    Review of Systems   Review of Systems  Gastrointestinal:  Positive for abdominal pain.  Musculoskeletal:  Positive for back pain.    Physical Exam Updated Vital Signs BP 127/78 (BP Location: Left Arm)   Pulse 66   Temp 98.3 F (36.8 C) (Oral)   Resp 18   Ht 5\' 6"  (1.676 m)   Wt 76.2 kg   SpO2 100%   BMI 27.12 kg/m  Physical Exam Vitals and nursing note reviewed.  Constitutional:      Appearance: She is well-developed.  HENT:     Head: Normocephalic and atraumatic.  Cardiovascular:     Rate and Rhythm: Normal rate and regular rhythm.  Pulmonary:     Effort: No respiratory distress.     Breath sounds: No stridor.  Abdominal:     General: There is no distension.  Musculoskeletal:     Cervical back: Normal range of motion.  Neurological:     Mental Status: She is alert.     ED Results / Procedures / Treatments   Labs (all labs ordered are listed, but only abnormal results are displayed) Labs Reviewed  COMPREHENSIVE METABOLIC PANEL - Abnormal; Notable for the following components:  Result Value   Glucose, Bld 137 (*)    AST 449 (*)    ALT 248 (*)    Alkaline Phosphatase 290 (*)    Total Bilirubin 2.0 (*)    All other components within normal limits  LIPASE, BLOOD  CBC  HCG, SERUM, QUALITATIVE  URINALYSIS, ROUTINE W REFLEX MICROSCOPIC  TROPONIN I (HIGH SENSITIVITY)    EKG EKG Interpretation Date/Time:  Thursday July 16 2022 02:32:36 EDT Ventricular Rate:  61 PR Interval:  148 QRS Duration:  90 QT Interval:  432 QTC Calculation: 434 R Axis:   40  Text Interpretation: Normal sinus rhythm Low voltage QRS Cannot rule out Anterior infarct , age undetermined Abnormal ECG When compared with ECG of 03-May-2022 08:11, PREVIOUS ECG IS PRESENT Confirmed by Marily Memos [phone]) on 07/16/2022 4:02:34 AM  Radiology No results found.  Procedures Procedures    Medications Ordered in ED Medications   fentaNYL (SUBLIMAZE) injection 50 mcg (has no administration in time range)  lactated ringers bolus 1,000 mL (has no administration in time range)    ED Course/ Medical Decision Making/ A&P                             Medical Decision Making Amount and/or Complexity of Data Reviewed Radiology: ordered.  Risk Prescription drug management.  Concern for cholecystitis will start with [place]. Unlikely ACS, second trop canceled.  My read and interpreation of the [place] appears to have thickened wall with sludge and stones. No fluid. Cbd ok. Will consult surgery 2/2 biliary colic and elevated enzymes.   D/w Dr. Bedelia Person who will see for likely surgery today. NPO since noon. No blood thinners. Will add on LR infusion pending their eval. Patient and family aware.    Final Clinical Impression(s) / ED Diagnoses Final diagnoses:  None    Rx / DC Orders ED Discharge Orders     None         Burdette Forehand, Barbara Cower, MD 07/16/22 [phone]

## 2022-07-16 NOTE — Plan of Care (Signed)

## 2022-07-16 NOTE — Anesthesia Preprocedure Evaluation (Addendum)
Anesthesia Evaluation  Patient identified by MRN, date of birth, ID band Patient awake    Reviewed: Allergy & Precautions, NPO status , Patient's Chart, lab work & pertinent test results  Airway Mallampati: II  TM Distance: >3 FB Neck ROM: Full    Dental no notable dental hx. (+) Teeth Intact, Implants   Pulmonary neg pulmonary ROS   Pulmonary exam normal breath sounds clear to auscultation       Cardiovascular negative cardio ROS Normal cardiovascular exam Rhythm:Regular Rate:Normal     Neuro/Psych  Headaches PSYCHIATRIC DISORDERS Anxiety        GI/Hepatic Neg liver ROS,GERD  ,,  Endo/Other  negative endocrine ROS    Renal/GU Lab Results      Component                Value               Date                      NA                       136                 07/16/2022                CL                       100                 07/16/2022                K                        3.6                 07/16/2022                CO2                      22                  07/16/2022                BUN                      6                   07/16/2022                CREATININE               0.88                07/16/2022                           Musculoskeletal negative musculoskeletal ROS (+)    Abdominal   Peds  Hematology Lab Results      Component                Value               Date                      WBC  7.8                 07/16/2022                HGB                      13.7                07/16/2022                HCT                      41.0                07/16/2022                MCV                      84.7                07/16/2022                PLT                      226                 07/16/2022              Anesthesia Other Findings   Reproductive/Obstetrics                             Anesthesia Physical Anesthesia Plan  ASA:  1  Anesthesia Plan: General   Post-op Pain Management: Toradol IV (intra-op)* and Tylenol PO (pre-op)*   Induction: Intravenous  PONV Risk Score and Plan: 4 or greater and Treatment may vary due to age or medical condition, Midazolam, Ondansetron and Dexamethasone  Airway Management Planned: Oral ETT  Additional Equipment: None  Intra-op Plan:   Post-operative Plan: Extubation in OR  Informed Consent: I have reviewed the patients History and Physical, chart, labs and discussed the procedure including the risks, benefits and alternatives for the proposed anesthesia with the patient or authorized representative who has indicated his/her understanding and acceptance.     Dental advisory given  Plan Discussed with:   Anesthesia Plan Comments:         Anesthesia Quick Evaluation

## 2022-07-16 NOTE — H&P (Signed)
Reason for Consult/Chief Complaint: hyperbilirubinemia Consultant: Mesner, MD  Amy Stout is an 57 y.o. female.   HPI: 52F with recurrent RUQ and R shoulder pain that usually goes away in a few hours, but began at 1700 on 7/10 without relief, causing her to present to the ED. Pain has resolved with pain medication in the ED. She denies n/v. She denies any prior abdominal surgery.   Past Medical History:  Diagnosis Date   Anxiety    GERD (gastroesophageal reflux disease)    Migraines     Past Surgical History:  Procedure Laterality Date   COLONOSCOPY WITH PROPOFOL N/A 04/18/2020   Procedure: COLONOSCOPY WITH PROPOFOL;  Surgeon: Corbin Ade, MD;  [facility];  Service: Endoscopy;  Laterality: N/A;  AM   none     POLYPECTOMY  04/18/2020   Procedure: POLYPECTOMY;  Surgeon: Corbin Ade, MD;  Location: [facility];  Service: Endoscopy;;    Family History  Problem Relation Age of Onset   Arthritis Mother    Depression Mother    Hearing loss Mother    Miscarriages / [place] Mother    Alcohol abuse Father    Cancer Father        prostate   Diabetes Father    Heart disease Father    Asthma Maternal Aunt    Asthma Maternal Uncle    Uterine cancer Paternal Grandmother    Stroke Paternal Grandfather    Colon cancer Maternal Uncle     Social History:  reports that she has never smoked. She has never used smokeless tobacco. She reports that she does not drink alcohol and does not use drugs.  Allergies: No Known Allergies  Medications: I have reviewed the patient's current medications.  Results for orders placed or performed during the hospital encounter of 07/16/22 (from the past 48 hour(s))  Urinalysis, Routine w reflex microscopic -Urine, Clean Catch     Status: Abnormal   Collection Time: 07/16/22  2:39 AM  Result Value Ref Range   Color, Urine YELLOW YELLOW   APPearance CLEAR CLEAR   Specific Gravity, Urine 1.006 1.005 - 1.030   pH 9.0 (H)  5.0 - 8.0   Glucose, UA NEGATIVE NEGATIVE mg/dL   Hgb urine dipstick NEGATIVE NEGATIVE   Bilirubin Urine NEGATIVE NEGATIVE   Ketones, ur NEGATIVE NEGATIVE mg/dL   Protein, ur NEGATIVE NEGATIVE mg/dL   Nitrite NEGATIVE NEGATIVE   Leukocytes,Ua TRACE (A) NEGATIVE   RBC / HPF 0-5 0 - 5 RBC/hpf   WBC, UA 0-5 0 - 5 WBC/hpf   Bacteria, UA RARE (A) NONE SEEN   Squamous Epithelial / HPF 0-5 0 - 5 /HPF   Mucus PRESENT     Comment: Performed at [facility], [address] [place], [place] [address]  Lipase, blood     Status: None   Collection Time: 07/16/22  2:52 AM  Result Value Ref Range   Lipase 31 11 - 51 U/L    Comment: Performed at [facility], [address], [place], [place] [address]  Comprehensive metabolic panel     Status: Abnormal   Collection Time: 07/16/22  2:52 AM  Result Value Ref Range   Sodium 136 135 - 145 mmol/L   Potassium 3.6 3.5 - 5.1 mmol/L   Chloride 100 98 - 111 mmol/L   CO2 22 22 - 32 mmol/L   Glucose, Bld 137 (H) 70 - 99 mg/dL    Comment:  Glucose reference range applies only to samples taken after fasting for at least 8 hours.   BUN 6 6 - 20 mg/dL   Creatinine, Ser 0.10 0.44 - 1.00 mg/dL   Calcium 9.6 8.9 - 27.2 mg/dL   Total Protein 7.0 6.5 - 8.1 g/dL   Albumin 3.9 3.5 - 5.0 g/dL   AST 536 (H) 15 - 41 U/L   ALT 248 (H) 0 - 44 U/L   Alkaline Phosphatase 290 (H) 38 - 126 U/L   Total Bilirubin 2.0 (H) 0.3 - 1.2 mg/dL   GFR, Estimated >64 >40 mL/min    Comment: (NOTE) Calculated using the CKD-EPI Creatinine Equation (2021)    Anion gap 14 5 - 15    Comment: Performed at [facility], [address], [place], [place] [address]  CBC     Status: None   Collection Time: 07/16/22  2:52 AM  Result Value Ref Range   WBC 7.8 4.0 - 10.5 K/uL   RBC 4.84 3.87 - 5.11 MIL/uL   Hemoglobin 13.7 12.0 - 15.0 g/dL   HCT 59.5 63.8 - 75.6 %   MCV 84.7 80.0 - 100.0 fL   MCH 28.3 26.0 - 34.0 pg   MCHC 33.4 30.0 - 36.0 g/dL   RDW 43.3 29.5 - 18.8 %    Platelets 226 150 - 400 K/uL   nRBC 0.0 0.0 - 0.2 %    Comment: Performed at [facility], [address], [place], [place] [address]  hCG, serum, qualitative     Status: None   Collection Time: 07/16/22  2:52 AM  Result Value Ref Range   Preg, Serum NEGATIVE NEGATIVE    Comment: Performed at [facility], [address] [place], [place] [address]  Troponin I (High Sensitivity)     Status: None   Collection Time: 07/16/22  2:52 AM  Result Value Ref Range   Troponin I (High Sensitivity) 4 <18 ng/L    Comment: (NOTE) Elevated high sensitivity troponin I (hsTnI) values and significant  changes across serial measurements may suggest ACS but many other  chronic and acute conditions are known to elevate hsTnI results.  Refer to the "Links" section for chest pain algorithms and additional  guidance. Performed at [facility], [address] [place], [place] [address]     US Abdomen Limited RUQ (LIVER/GB)  Result Date: 07/16/2022 CLINICAL DATA:  [id number] with abdominal pain. EXAM: ULTRASOUND ABDOMEN LIMITED RIGHT UPPER QUADRANT COMPARISON:  None Available. FINDINGS: Gallbladder: There is no elicited right upper abdominal tenderness on exam. The gallbladder however, is filled with sludge and small shadowing stones and there is free wall thickening to 5 mm. Common bile duct: Diameter: 4.8 mm with no intrahepatic biliary prominence Liver: No focal lesion identified. Within normal limits in parenchymal echogenicity. Portal vein is patent on color Doppler imaging with normal direction of blood flow towards the liver. Other: None. IMPRESSION: 1. Gallbladder is filled with sludge and small shadowing stones and there is free wall thickening to 5 mm. No elicited right upper abdominal tenderness on exam. The findings may suggest pain medicated acute cholecystitis, chronic cholecystitis, or reactive or congestive wall thickening. 2. No biliary dilatation. Electronically Signed   By: Almira Bar M.D.   On: 07/16/2022 04:48    ROS 10 point review of systems is negative except as listed above in HPI.   Physical Exam Blood pressure (!) 133/97, pulse 67, temperature 97.9 F (36.6 C),  temperature source Oral, resp. rate 16, height 5\' 6"  (1.676 m), weight 76.2 kg, SpO2 100%. Constitutional: well-developed, well-nourished HEENT: pupils equal, round, reactive to light, 2mm b/l, moist conjunctiva, external inspection of ears and nose normal, hearing intact Oropharynx: normal oropharyngeal mucosa, normal dentition Neck: no thyromegaly, trachea midline, no midline cervical tenderness to palpation Chest: breath sounds equal bilaterally, normal respiratory effort, no midline or lateral chest wall tenderness to palpation/deformity Abdomen: soft, NT, no bruising, no hepatosplenomegaly Skin: warm, dry, no rashes Psych: normal memory, normal mood/affect     Assessment/Plan: 79F with symptomatic cholelithiasis and hyperbilirubinemia. Recommend lap chole with IOC. Informed consent was obtained after detailed explanation of risks, including bleeding, infection, biloma, hematoma, injury to common bile duct, need for IOC to delineate anatomy, and need for conversion to open procedure. All questions answered to the patient's satisfaction. D/w day team regarding scheduling.    Diamantina Monks, MD General and Trauma Surgery [facility]

## 2022-07-17 ENCOUNTER — Observation Stay (HOSPITAL_COMMUNITY): Payer: BC Managed Care – PPO

## 2022-07-17 ENCOUNTER — Observation Stay (HOSPITAL_COMMUNITY): Payer: BC Managed Care – PPO | Admitting: Anesthesiology

## 2022-07-17 ENCOUNTER — Encounter (HOSPITAL_COMMUNITY): Admission: EM | Disposition: A | Discharge status: Home / Self Care | Payer: Self-pay | Source: Home / Self Care

## 2022-07-17 ENCOUNTER — Other Ambulatory Visit: Payer: Self-pay

## 2022-07-17 ENCOUNTER — Encounter (HOSPITAL_COMMUNITY): Payer: Self-pay

## 2022-07-17 DIAGNOSIS — K8010 Calculus of gallbladder with chronic cholecystitis without obstruction: Secondary | ICD-10-CM | POA: Diagnosis not present

## 2022-07-17 HISTORY — PX: INTRAOPERATIVE CHOLANGIOGRAM: SHX5230

## 2022-07-17 HISTORY — PX: CHOLECYSTECTOMY: SHX55

## 2022-07-17 LAB — COMPREHENSIVE METABOLIC PANEL
ALT: 473 U/L — ABNORMAL HIGH (ref 0–44)
AST: 346 U/L — ABNORMAL HIGH (ref 15–41)
Albumin: 2.9 g/dL — ABNORMAL LOW (ref 3.5–5.0)
Alkaline Phosphatase: 288 U/L — ABNORMAL HIGH (ref 38–126)
Anion gap: 6 (ref 5–15)
BUN: <5 mg/dL — ABNORMAL LOW (ref 6–20)
CO2: 28 mmol/L (ref 22–32)
Calcium: 8.9 mg/dL (ref 8.9–10.3)
Chloride: 104 mmol/L (ref 98–111)
Creatinine, Ser: 0.78 mg/dL (ref 0.44–1.00)
GFR, Estimated: >60 mL/min (ref >60)
Glucose, Bld: 107 mg/dL — ABNORMAL HIGH (ref 70–99)
Potassium: 3.9 mmol/L (ref 3.5–5.1)
Sodium: 138 mmol/L (ref 135–145)
Total Bilirubin: 3.3 mg/dL — ABNORMAL HIGH (ref 0.3–1.2)
Total Protein: 5.8 g/dL — ABNORMAL LOW (ref 6.5–8.1)

## 2022-07-17 LAB — CBC
HCT: 36.3 % (ref 36.0–46.0)
Hemoglobin: 12.1 g/dL (ref 12.0–15.0)
MCH: 28.8 pg (ref 26.0–34.0)
MCHC: 33.3 g/dL (ref 30.0–36.0)
MCV: 86.4 fL (ref 80.0–100.0)
Platelets: 180 10*3/uL (ref 150–400)
RBC: 4.2 MIL/uL (ref 3.87–5.11)
RDW: 12.6 % (ref 11.5–15.5)
WBC: 4.6 10*3/uL (ref 4.0–10.5)
nRBC: 0 % (ref 0.0–0.2)

## 2022-07-17 SURGERY — LAPAROSCOPIC CHOLECYSTECTOMY
Anesthesia: General

## 2022-07-17 MED ORDER — FENTANYL CITRATE (PF) 250 MCG/5ML IJ SOLN
INTRAMUSCULAR | Status: DC | PRN
  Administered 2022-07-17: 50 ug via INTRAVENOUS
  Administered 2022-07-17: 100 ug via INTRAVENOUS
  Administered 2022-07-17 (×2): 50 ug via INTRAVENOUS

## 2022-07-17 MED ORDER — DEXMEDETOMIDINE HCL IN NACL 400 MCG/100ML IV SOLN
INTRAVENOUS | Status: DC | PRN
  Administered 2022-07-17: 8 ug via INTRAVENOUS

## 2022-07-17 MED ORDER — KETOROLAC TROMETHAMINE 30 MG/ML IJ SOLN: 30.0000 mg | Freq: Once | INTRAMUSCULAR | Status: DC | PRN

## 2022-07-17 MED ORDER — LACTATED RINGERS IV SOLN: INTRAVENOUS | Status: DC

## 2022-07-17 MED ORDER — ACETAMINOPHEN 500 MG PO TABS
ORAL_TABLET | ORAL | Status: AC
  Administered 2022-07-17: 1000 mg via ORAL
  Filled 2022-07-17: qty 2

## 2022-07-17 MED ORDER — SODIUM CHLORIDE 0.9 % IV SOLN
INTRAVENOUS | Status: DC | PRN
  Administered 2022-07-17: 7 mL

## 2022-07-17 MED ORDER — SODIUM CHLORIDE 0.9 % IR SOLN
Status: DC | PRN
  Administered 2022-07-17: 2000 mL

## 2022-07-17 MED ORDER — HYDROMORPHONE HCL 1 MG/ML IJ SOLN
INTRAMUSCULAR | Status: AC
  Filled 2022-07-17: qty 1

## 2022-07-17 MED ORDER — CHLORHEXIDINE GLUCONATE 0.12 % MT SOLN
OROMUCOSAL | Status: AC
  Administered 2022-07-17: 15 mL via OROMUCOSAL
  Filled 2022-07-17: qty 15

## 2022-07-17 MED ORDER — OXYCODONE HCL 5 MG PO TABS: 5.0000 mg | ORAL_TABLET | Freq: Once | ORAL | Status: DC | PRN

## 2022-07-17 MED ORDER — ACETAMINOPHEN 500 MG PO TABS
1000.0000 mg | ORAL_TABLET | Freq: Once | ORAL | Status: AC
  Filled 2022-07-17: qty 2

## 2022-07-17 MED ORDER — OXYCODONE HCL 5 MG/5ML PO SOLN: 5.0000 mg | Freq: Once | ORAL | Status: DC | PRN

## 2022-07-17 MED ORDER — PHENYLEPHRINE 80 MCG/ML (10ML) SYRINGE FOR IV PUSH (FOR BLOOD PRESSURE SUPPORT)
PREFILLED_SYRINGE | INTRAVENOUS | Status: DC | PRN
  Administered 2022-07-17: 80 ug via INTRAVENOUS

## 2022-07-17 MED ORDER — LIDOCAINE 2% (20 MG/ML) 5 ML SYRINGE
INTRAMUSCULAR | Status: DC | PRN
  Administered 2022-07-17: 40 mg via INTRAVENOUS

## 2022-07-17 MED ORDER — BUPIVACAINE-EPINEPHRINE (PF) 0.25% -1:200000 IJ SOLN
INTRAMUSCULAR | Status: AC
  Filled 2022-07-17: qty 30

## 2022-07-17 MED ORDER — ONDANSETRON HCL 4 MG/2ML IJ SOLN
INTRAMUSCULAR | Status: DC | PRN
  Administered 2022-07-17: 4 mg via INTRAVENOUS

## 2022-07-17 MED ORDER — 0.9 % SODIUM CHLORIDE (POUR BTL) OPTIME
TOPICAL | Status: DC | PRN
  Administered 2022-07-17: 1000 mL

## 2022-07-17 MED ORDER — OXYCODONE HCL 5 MG PO TABS
10.0000 mg | ORAL_TABLET | Freq: Four times a day (QID) | ORAL | Status: DC | PRN
  Administered 2022-07-17 – 2022-07-18 (×2): 10 mg via ORAL
  Filled 2022-07-17 (×2): qty 2

## 2022-07-17 MED ORDER — PROPOFOL 10 MG/ML IV BOLUS
INTRAVENOUS | Status: AC
  Filled 2022-07-17: qty 20

## 2022-07-17 MED ORDER — ONDANSETRON HCL 4 MG/2ML IJ SOLN: 4.0000 mg | Freq: Once | INTRAMUSCULAR | Status: DC | PRN

## 2022-07-17 MED ORDER — BUPIVACAINE-EPINEPHRINE 0.25% -1:200000 IJ SOLN
INTRAMUSCULAR | Status: DC | PRN
  Administered 2022-07-17: 10 mL

## 2022-07-17 MED ORDER — HYDROMORPHONE HCL 1 MG/ML IJ SOLN
0.2500 mg | INTRAMUSCULAR | Status: DC | PRN
  Administered 2022-07-17 (×3): 0.5 mg via INTRAVENOUS

## 2022-07-17 MED ORDER — SUGAMMADEX SODIUM 200 MG/2ML IV SOLN
INTRAVENOUS | Status: DC | PRN
  Administered 2022-07-17: 300 mg via INTRAVENOUS

## 2022-07-17 MED ORDER — DEXAMETHASONE SODIUM PHOSPHATE 10 MG/ML IJ SOLN
INTRAMUSCULAR | Status: DC | PRN
  Administered 2022-07-17: 10 mg via INTRAVENOUS

## 2022-07-17 MED ORDER — KETOROLAC TROMETHAMINE 30 MG/ML IJ SOLN
INTRAMUSCULAR | Status: DC | PRN
  Administered 2022-07-17: 30 mg via INTRAVENOUS

## 2022-07-17 MED ORDER — MIDAZOLAM HCL 2 MG/2ML IJ SOLN
INTRAMUSCULAR | Status: AC
  Filled 2022-07-17: qty 2

## 2022-07-17 MED ORDER — ORAL CARE MOUTH RINSE: 15.0000 mL | Freq: Once | OROMUCOSAL | Status: AC

## 2022-07-17 MED ORDER — CHLORHEXIDINE GLUCONATE 0.12 % MT SOLN
15.0000 mL | Freq: Once | OROMUCOSAL | Status: AC
  Filled 2022-07-17 (×2): qty 15

## 2022-07-17 MED ORDER — FENTANYL CITRATE (PF) 250 MCG/5ML IJ SOLN
INTRAMUSCULAR | Status: AC
  Filled 2022-07-17: qty 5

## 2022-07-17 MED ORDER — ROCURONIUM BROMIDE 10 MG/ML (PF) SYRINGE
PREFILLED_SYRINGE | INTRAVENOUS | Status: DC | PRN
  Administered 2022-07-17: 50 mg via INTRAVENOUS

## 2022-07-17 MED ORDER — MIDAZOLAM HCL 2 MG/2ML IJ SOLN
INTRAMUSCULAR | Status: DC | PRN
  Administered 2022-07-17: 2 mg via INTRAVENOUS

## 2022-07-17 SURGICAL SUPPLY — 47 items
ADH SKN CLS APL DERMABOND .7 (GAUZE/BANDAGES/DRESSINGS) ×1
APL PRP STRL LF DISP 70% ISPRP (MISCELLANEOUS) ×1
APPLIER CLIP ROT 10 11.4 M/L (STAPLE) ×1
APR CLP MED LRG 11.4X10 (STAPLE) ×1
BAG COUNTER SPONGE SURGICOUNT (BAG) ×1 IMPLANT
BAG SPEC RTRVL 10 TROC 200 (ENDOMECHANICALS) ×1
BAG SPNG CNTER NS LX DISP (BAG) ×1
BIOPATCH RED 1 DISK 7.0 (GAUZE/BANDAGES/DRESSINGS) IMPLANT
BLADE CLIPPER SURG (BLADE) IMPLANT
BULB SUCTION JP 400 (SUCTIONS) IMPLANT
CANISTER SUCT 3000ML PPV (MISCELLANEOUS) ×1 IMPLANT
CHLORAPREP W/TINT 26 (MISCELLANEOUS) ×1 IMPLANT
CLIP APPLIE ROT 10 11.4 M/L (STAPLE) ×1 IMPLANT
COVER SURGICAL LIGHT HANDLE (MISCELLANEOUS) ×1 IMPLANT
DERMABOND ADVANCED .7 DNX12 (GAUZE/BANDAGES/DRESSINGS) ×1 IMPLANT
DRAIN CHANNEL 19F RND (DRAIN) IMPLANT
DRSG TEGADERM 4X4.75 (GAUZE/BANDAGES/DRESSINGS) IMPLANT
ELECT REM PT RETURN 9FT ADLT (ELECTROSURGICAL) ×1
ELECTRODE REM PT RTRN 9FT ADLT (ELECTROSURGICAL) ×1 IMPLANT
GLOVE BIO SURGEON STRL SZ8 (GLOVE) ×1 IMPLANT
GLOVE BIOGEL PI IND STRL 8 (GLOVE) ×1 IMPLANT
GOWN STRL REUS W/ TWL LRG LVL3 (GOWN DISPOSABLE) ×2 IMPLANT
GOWN STRL REUS W/ TWL XL LVL3 (GOWN DISPOSABLE) ×1 IMPLANT
GOWN STRL REUS W/TWL LRG LVL3 (GOWN DISPOSABLE) ×2
GOWN STRL REUS W/TWL XL LVL3 (GOWN DISPOSABLE) ×1
IRRIG SUCT STRYKERFLOW 2 WTIP (MISCELLANEOUS) ×1
IRRIGATION SUCT STRKRFLW 2 WTP (MISCELLANEOUS) ×1 IMPLANT
KIT BASIN OR (CUSTOM PROCEDURE TRAY) ×1 IMPLANT
KIT TURNOVER KIT B (KITS) ×1 IMPLANT
NS IRRIG 1000ML POUR BTL (IV SOLUTION) ×1 IMPLANT
PAD ARMBOARD 7.5X6 YLW CONV (MISCELLANEOUS) ×1 IMPLANT
POUCH RETRIEVAL ECOSAC 10 (ENDOMECHANICALS) ×1 IMPLANT
SCISSORS LAP 5X35 DISP (ENDOMECHANICALS) ×1 IMPLANT
SET TUBE SMOKE EVAC HIGH FLOW (TUBING) ×1 IMPLANT
SLEEVE Z-THREAD 5X100MM (TROCAR) ×1 IMPLANT
SPECIMEN JAR SMALL (MISCELLANEOUS) ×1 IMPLANT
SUT ETHILON 2 0 FS 18 (SUTURE) IMPLANT
SUT MNCRL AB 4-0 PS2 18 (SUTURE) ×1 IMPLANT
SUT VICRYL 0 UR6 27IN ABS (SUTURE) IMPLANT
TOWEL GREEN STERILE (TOWEL DISPOSABLE) ×1 IMPLANT
TOWEL GREEN STERILE FF (TOWEL DISPOSABLE) ×1 IMPLANT
TRAY LAPAROSCOPIC MC (CUSTOM PROCEDURE TRAY) ×1 IMPLANT
TROCAR 11X100 Z THREAD (TROCAR) ×1 IMPLANT
TROCAR BALLN 12MMX100 BLUNT (TROCAR) ×1 IMPLANT
TROCAR Z-THREAD OPTICAL 5X100M (TROCAR) ×1 IMPLANT
WARMER LAPAROSCOPE (MISCELLANEOUS) ×1 IMPLANT
WATER STERILE IRR 1000ML POUR (IV SOLUTION) ×1 IMPLANT

## 2022-07-17 NOTE — Plan of Care (Signed)

## 2022-07-17 NOTE — Transfer of Care (Signed)
Immediate Anesthesia Transfer of Care Note  Patient: Amy Stout  Procedure(s) Performed: LAPAROSCOPIC CHOLECYSTECTOMY INTRAOPERATIVE CHOLANGIOGRAM  Patient Location: PACU  Anesthesia Type:General  Level of Consciousness: awake, alert , and oriented  Airway & Oxygen Therapy: Patient Spontanous Breathing and Patient connected to nasal cannula oxygen  Post-op Assessment: Report given to RN and Post -op Vital signs reviewed and stable  Post vital signs: Reviewed and stable  Last Vitals:  Vitals Value Taken Time  BP 104/67 07/17/22 1430  Temp 37 C 07/17/22 1430  Pulse 57 07/17/22 1430  Resp 16 07/17/22 1430  SpO2 99 % 07/17/22 1430    Last Pain:  Vitals:   07/17/22 1430  TempSrc: Oral  PainSc:       Patients Stated Pain Goal: 0 (07/16/22 2020)  Complications: No notable events documented.

## 2022-07-17 NOTE — Anesthesia Procedure Notes (Signed)
Procedure Name: Intubation Date/Time: 07/17/2022 9:12 AM  Performed by: Gwenyth Allegra, CRNAPre-anesthesia Checklist: Patient identified, Emergency Drugs available, Timeout performed, Suction available and Patient being monitored Patient Re-evaluated:Patient Re-evaluated prior to induction Oxygen Delivery Method: Circle system utilized Ventilation: Mask ventilation without difficulty Laryngoscope Size: Mac and 3 Grade View: Grade III Tube type: Oral Tube size: 7.0 mm Airway Equipment and Method: Bougie stylet Placement Confirmation: ETT inserted through vocal cords under direct vision, positive ETCO2 and breath sounds checked- equal and bilateral Secured at: 22 cm Tube secured with: Tape Dental Injury: Teeth and Oropharynx as per pre-operative assessment and Injury to lip

## 2022-07-17 NOTE — Interval H&P Note (Signed)
History and Physical Interval Note:  07/17/2022 8:29 AM  Amy Stout  has presented today for surgery, with the diagnosis of lap chole.  The various methods of treatment have been discussed with the patient and family. After consideration of risks, benefits and other options for treatment, the patient has consented to  Procedure(s): LAPAROSCOPIC CHOLECYSTECTOMY (N/A) INTRAOPERATIVE CHOLANGIOGRAM (N/A) as a surgical intervention.  The patient's history has been reviewed, patient examined, no change in status, stable for surgery.  I have reviewed the patient's chart and labs.  Questions were answered to the patient's satisfaction.   Patient seen and examined agree with assessment  She does have a rise in her bilirubin but we will proceed with laparoscopic cholecystectomy with cholangiogram today.  May need ERCP postop.  I discussed this with the patient at the bedside.The procedure has been discussed with the patient. Operative and non operative treatments have been discussed. Risks of surgery include bleeding, infection,  Common bile duct injury,  Injury to the stomach,liver, colon,small intestine, abdominal wall,  Diaphragm,  Major blood vessels,  And the need for an open procedure.  Other risks include worsening of medical problems, death,  DVT and pulmonary embolism, and cardiovascular events.   Medical options have also been discussed. The patient has been informed of long term expectations of surgery and non surgical options,  The patient agrees to proceed.     Amy Stout A Hennessey Cantrell

## 2022-07-17 NOTE — Op Note (Signed)
Preoperative diagnosis: Acute cholecystitis with concern for choledocholithiasis  Postoperative diagnosis: Same  Procedure: Laparoscopic cholecystectomy with attempted cholangiogram  Surgeon: Harriette Bouillon, MD  Anesthesia General endotracheal anesthesia with 0.25% Marcaine plain  Drains 19 round gallbladder fossa  Specimen: Gallbladder to pathology  EBL: Minimal  IV fluids: Per anesthesia record  Indications for procedure: The patient is a pleasant 57 year old female admitted for gallstone disease concern for choledocholithiasis and acute cholecystitis.  She presents today for operative mention.  Of note her bilirubin had risen and I recommended a cholangiogram to evaluate that today during cholecystectomy.  We discussed the risks and benefits surgery as well as complications of surgery and additional treatments and/or alternatives to surgery.The procedure has been discussed with the patient. Operative and non operative treatments have been discussed. Risks of surgery include bleeding, infection,  Common bile duct injury,  Injury to the stomach,liver, colon,small intestine, abdominal wall,  Diaphragm,  Major blood vessels,  And the need for an open procedure.  Other risks include worsening of medical problems, death,  DVT and pulmonary embolism, and cardiovascular events.   Medical options have also been discussed. The patient has been informed of long term expectations of surgery and non surgical options,  The patient agrees to proceed.      Description of procedure: The patient was met in the holding area and questions were answered.  She was then taken back to the operating room and placed supine upon the operating table.  After induction of general anesthesia, the abdomen was prepped and draped in sterile fashion and timeout performed.  Proper patient, site and procedure verified.  A 1 cm infraumbilical incision was made and dissection was carried down to the midline.  The fascia was opened  and a pursestring suture of 0 Vicryl was placed.  A 12 mm cannula was placed and CO2 pressure was insufflated to 15 mmHg pressure.  Laparoscope placed.  She is placed in reverse Trendelenburg and rolled her left.  The gallbladder is identified.  An 11 mm subxiphoid port was placed and 2 additional 5 mm ports were placed in the right upper quadrant.  The gallbladder was grabbed by its dome and retracted to the patient's right upper quadrant.  A second grasper was used to grab the infant tibial and retracted laterally.  This opened up the triangle of CLO.  The cystic duct was dissected out circumferentially and was the only tubular structure in the gallbladder.  The cystic artery was also dissected out circumferentially.  She had an elevated bilirubin therefore cholangiogram was performed.  Through separate stab incision and a Cook cholangiogram catheter was introduced.  A small incision was made after clipping the gallbladder side of the cystic duct and the cystic duct.  The catheter was placed.  There is significant resistance to catheter despite multiple attempts to reposition.  I was able to get it and placed the clip but when I tried to flush contrast through it he was obstructed distally.  I suspect there is a stone as in the distal cystic duct obstructing this.  Multiple attempts were tried but unfortunately the cholangiogram could not be performed.  There was some leakage at the posterior wall of the cystic duct and I felt at this point time he would be safe for to clip the duct and proceed the MRCP postoperative further evaluation.  The cystic duct was then divided between clips.  I went ahead and divided the cystic artery as well.  The gallbladder was then dissected  off the gallbladder bed.  In the mid gallbladder bed there is a small duct of Luschka encountered with a small amount of bile that leak from it.  I placed a clip across this.  I did not see any more evidence of leakage from the gallbladder bed of  bile.  The remainder of the dissection was uneventful.  Cautery was used for hemostasis.  The gallbladder was then placed into an Endo Catch bag.  There was some spillage of bile from the gallbladder during this process but this was suctioned out and there were no stones spilled.  This was placed in an EKOS sac.  The gallbladder bed was irrigated and made hemostatic with cautery.  Given the ductal lesion I opted to place a 19 round drain into the gallbladder bed for monitoring purposes.  We then extracted the gallbladder through the umbilical port site and closed it with interrupted 0 Vicryl.  Reinspection showed no bile leakage from the gallbladder bed or bleeding.  The drain was in good position and secured to the skin with 2-0 nylon.  It was placed to bulb suction.  We removed our trocars on the CO2 to escape with no signs of trocar site bleeding.  The skin was closed with 4-0 Monocryl.  Dermabond applied.  All counts found to be correct.  The patient was awoke extubated taken to recovery in satisfactory condition.  Plan will be for postoperative MRCP to further evaluate her common duct given the inability to perform cholangiogram today.

## 2022-07-17 NOTE — Anesthesia Postprocedure Evaluation (Signed)
Anesthesia Post Note  Patient: Amy Stout  Procedure(s) Performed: LAPAROSCOPIC CHOLECYSTECTOMY INTRAOPERATIVE CHOLANGIOGRAM     Patient location during evaluation: PACU Anesthesia Type: General Level of consciousness: awake and alert Pain management: pain level controlled Vital Signs Assessment: post-procedure vital signs reviewed and stable Respiratory status: spontaneous breathing, nonlabored ventilation, respiratory function stable and patient connected to nasal cannula oxygen Cardiovascular status: blood pressure returned to baseline and stable Postop Assessment: no apparent nausea or vomiting Anesthetic complications: no   No notable events documented.  Last Vitals:  Vitals:   07/17/22 1334 07/17/22 1430  BP: 104/67 104/67  Pulse: (!) 46 (!) 57  Resp: 16 16  Temp: 37.1 C 37 C  SpO2: 98% 99%    Last Pain:  Vitals:   07/17/22 1430  TempSrc: Oral  PainSc:                  Trevor Iha

## 2022-07-18 ENCOUNTER — Observation Stay (HOSPITAL_COMMUNITY): Payer: BC Managed Care – PPO

## 2022-07-18 ENCOUNTER — Encounter (HOSPITAL_COMMUNITY): Payer: Self-pay | Admitting: Surgery

## 2022-07-18 DIAGNOSIS — J9811 Atelectasis: Secondary | ICD-10-CM | POA: Diagnosis not present

## 2022-07-18 DIAGNOSIS — K8012 Calculus of gallbladder with acute and chronic cholecystitis without obstruction: Secondary | ICD-10-CM | POA: Diagnosis present

## 2022-07-18 DIAGNOSIS — Z79899 Other long term (current) drug therapy: Secondary | ICD-10-CM | POA: Diagnosis not present

## 2022-07-18 DIAGNOSIS — Z9889 Other specified postprocedural states: Secondary | ICD-10-CM | POA: Diagnosis not present

## 2022-07-18 DIAGNOSIS — K8000 Calculus of gallbladder with acute cholecystitis without obstruction: Secondary | ICD-10-CM | POA: Diagnosis not present

## 2022-07-18 DIAGNOSIS — R7989 Other specified abnormal findings of blood chemistry: Secondary | ICD-10-CM | POA: Diagnosis not present

## 2022-07-18 DIAGNOSIS — F419 Anxiety disorder, unspecified: Secondary | ICD-10-CM | POA: Diagnosis present

## 2022-07-18 DIAGNOSIS — K8050 Calculus of bile duct without cholangitis or cholecystitis without obstruction: Secondary | ICD-10-CM | POA: Diagnosis not present

## 2022-07-18 DIAGNOSIS — K838 Other specified diseases of biliary tract: Secondary | ICD-10-CM | POA: Diagnosis not present

## 2022-07-18 DIAGNOSIS — R17 Unspecified jaundice: Secondary | ICD-10-CM | POA: Diagnosis not present

## 2022-07-18 DIAGNOSIS — R933 Abnormal findings on diagnostic imaging of other parts of digestive tract: Secondary | ICD-10-CM

## 2022-07-18 DIAGNOSIS — K219 Gastro-esophageal reflux disease without esophagitis: Secondary | ICD-10-CM | POA: Diagnosis present

## 2022-07-18 DIAGNOSIS — R932 Abnormal findings on diagnostic imaging of liver and biliary tract: Secondary | ICD-10-CM | POA: Diagnosis not present

## 2022-07-18 LAB — COMPREHENSIVE METABOLIC PANEL
ALT: 333 U/L — ABNORMAL HIGH (ref 0–44)
AST: 140 U/L — ABNORMAL HIGH (ref 15–41)
Albumin: 2.9 g/dL — ABNORMAL LOW (ref 3.5–5.0)
Alkaline Phosphatase: 253 U/L — ABNORMAL HIGH (ref 38–126)
Anion gap: 11 (ref 5–15)
BUN: 7 mg/dL (ref 6–20)
CO2: 22 mmol/L (ref 22–32)
Calcium: 8.7 mg/dL — ABNORMAL LOW (ref 8.9–10.3)
Chloride: 104 mmol/L (ref 98–111)
Creatinine, Ser: 0.83 mg/dL (ref 0.44–1.00)
GFR, Estimated: >60 mL/min (ref >60)
Glucose, Bld: 107 mg/dL — ABNORMAL HIGH (ref 70–99)
Potassium: 3.4 mmol/L — ABNORMAL LOW (ref 3.5–5.1)
Sodium: 137 mmol/L (ref 135–145)
Total Bilirubin: 1.1 mg/dL (ref 0.3–1.2)
Total Protein: 5.6 g/dL — ABNORMAL LOW (ref 6.5–8.1)

## 2022-07-18 LAB — CBC
HCT: 38.3 % (ref 36.0–46.0)
Hemoglobin: 12.4 g/dL (ref 12.0–15.0)
MCH: 29.1 pg (ref 26.0–34.0)
MCHC: 32.4 g/dL (ref 30.0–36.0)
MCV: 89.9 fL (ref 80.0–100.0)
Platelets: 136 10*3/uL — ABNORMAL LOW (ref 150–400)
RBC: 4.26 MIL/uL (ref 3.87–5.11)
RDW: 13.1 % (ref 11.5–15.5)
WBC: 7.6 10*3/uL (ref 4.0–10.5)
nRBC: 0 % (ref 0.0–0.2)

## 2022-07-18 MED ORDER — GADOBUTROL 1 MMOL/ML IV SOLN
7.5000 mL | Freq: Once | INTRAVENOUS | Status: AC | PRN
  Administered 2022-07-18: 7.5 mL via INTRAVENOUS

## 2022-07-18 MED ORDER — ACETAMINOPHEN 500 MG PO TABS
1000.0000 mg | ORAL_TABLET | Freq: Three times a day (TID) | ORAL | Status: DC
  Administered 2022-07-18 – 2022-07-20 (×5): 1000 mg via ORAL
  Filled 2022-07-18 (×7): qty 2

## 2022-07-18 MED ORDER — OXYCODONE HCL 5 MG PO TABS
5.0000 mg | ORAL_TABLET | ORAL | Status: DC | PRN
  Administered 2022-07-20: 5 mg via ORAL
  Administered 2022-07-20: 10 mg via ORAL
  Filled 2022-07-18: qty 1
  Filled 2022-07-18: qty 2

## 2022-07-18 MED ORDER — MORPHINE SULFATE (PF) 2 MG/ML IV SOLN
1.0000 mg | INTRAVENOUS | Status: DC | PRN
  Administered 2022-07-18 – 2022-07-19 (×3): 2 mg via INTRAVENOUS
  Filled 2022-07-18 (×3): qty 1

## 2022-07-18 MED ORDER — KETOROLAC TROMETHAMINE 15 MG/ML IJ SOLN
15.0000 mg | Freq: Three times a day (TID) | INTRAMUSCULAR | Status: AC
  Administered 2022-07-18 – 2022-07-20 (×6): 15 mg via INTRAVENOUS
  Filled 2022-07-18 (×6): qty 1

## 2022-07-18 MED ORDER — PANTOPRAZOLE SODIUM 40 MG PO TBEC
40.0000 mg | DELAYED_RELEASE_TABLET | Freq: Every day | ORAL | Status: DC
  Administered 2022-07-18 – 2022-07-20 (×3): 40 mg via ORAL
  Filled 2022-07-18 (×3): qty 1

## 2022-07-18 NOTE — Progress Notes (Signed)
1 Day Post-Op  Subjective: CC: Back from MRCP. Sore around her incisions. Tolerating cld without n/v. Voiding. No flatus. Mobilizing oob. Her husband is at bedside.   Afebrile. No tachycardia or hypotension. Labs pending this am.   Objective: Vital signs in last 24 hours: Temp:  [97.6 F (36.4 C)-98.8 F (37.1 C)] 98.2 F (36.8 C) (07/13 0727) Pulse Rate:  [46-61] 61 (07/13 0727) Resp:  [13-20] 18 (07/13 0727) BP: (95-138)/(63-76) 138/72 (07/13 0727) SpO2:  [95 %-100 %] 100 % (07/13 0727) Last BM Date : 07/14/22  Intake/Output from previous day: 07/12 0701 - 07/13 0700 In: 1000 [I.V.:1000] Out: 85 [Drains:85] Intake/Output this shift: Total I/O In: 360 [P.O.:360] Out: 0   PE:  Gen:  Alert, NAD, pleasant Card:  Reg Pulm:  CTAB, no W/R/R, effort normal Abd: Soft,  distension, appropriately tender around laparoscopic incisions, no rigidity or guarding and otherwise NT, +BS. Incisions with glue intact appears well and are without drainage, bleeding, or signs of infection. JP drain SS.  Psych: A&Ox3   Lab Results:  Recent Labs    07/16/22 0252 07/17/22 0348  WBC 7.8 4.6  HGB 13.7 12.1  HCT 41.0 36.3  PLT 226 180   BMET Recent Labs    07/16/22 0252 07/17/22 0348  NA 136 138  K 3.6 3.9  CL 100 104  CO2 22 28  GLUCOSE 137* 107*  BUN 6 <5*  CREATININE 0.88 0.78  CALCIUM 9.6 8.9   PT/INR No results for input(s): "LABPROT", "INR" in the last 72 hours. CMP     Component Value Date/Time   NA 138 07/17/2022 0348   NA 142 10/30/2021 1250   K 3.9 07/17/2022 0348   CL 104 07/17/2022 0348   CO2 28 07/17/2022 0348   GLUCOSE 107 (H) 07/17/2022 0348   BUN <5 (L) 07/17/2022 0348   BUN 11 10/30/2021 1250   CREATININE 0.78 07/17/2022 0348   CALCIUM 8.9 07/17/2022 0348   PROT 5.8 (L) 07/17/2022 0348   PROT 7.3 10/30/2021 1250   ALBUMIN 2.9 (L) 07/17/2022 0348   ALBUMIN 4.5 10/30/2021 1250   AST 346 (H) 07/17/2022 0348   ALT 473 (H) 07/17/2022 0348    ALKPHOS 288 (H) 07/17/2022 0348   BILITOT 3.3 (H) 07/17/2022 0348   BILITOT 0.3 10/30/2021 1250   GFRNONAA >60 07/17/2022 0348   GFRAA 100 11/21/2019 1621   Lipase     Component Value Date/Time   LIPASE 31 07/16/2022 0252    Studies/Results: DG C-Arm 1-60 Min-No Report  Result Date: 07/17/2022 Fluoroscopy was utilized by the requesting physician.  No radiographic interpretation.    Anti-infectives: Anti-infectives (From admission, onward)    Start     Dose/Rate Route Frequency Ordered Stop   07/16/22 0815  cefTRIAXone (ROCEPHIN) 2 g in sodium chloride 0.9 % 100 mL IVPB        2 g 200 mL/hr over 30 Minutes Intravenous Every 24 hours 07/16/22 0804 07/23/22 0814        Assessment/Plan POD 1 s/p Laparoscopic Cholecystectomy for Acute Cholecystitis by Dr. Luisa Hart on 07/17/22 - Noted rising T. Bili to 3.3 yesterday pre-op. IOC unable to be done intra-op. Repeat labs pending this am. MRCP pending to r/o choledocholithiasis  - Cont JP drain, currently SS - Would leave on CLD for now till labs and mrcp come back - Mobilize, pulm toilet  FEN - CLD, IVF currently at 49ml/hr.  VTE - SCDs, Lovenox  ID - Rocephin written for 7d  LOS: 0 days    Jacinto Halim , [facility] 07/18/2022, 11:09 AM Please see Amion for pager number during day hours 7:00am-4:30pm

## 2022-07-18 NOTE — H&P (View-Only) (Signed)
Consultation  Referring Provider: [facility]/Dr. Andrey Campanile Primary Care Physician:  Junie Spencer, FNP Primary Gastroenterologist: Merrie Roof Dr. Jena Gauss  Reason for Consultation: Choledocholithiasis  HPI: Amy Stout is a 57 y.o. female who was admitted through the emergency room on 07/16/2022 when she presented with acute abdominal pain located mainly in the right upper quadrant and radiating to the right shoulder.  Per the notes she had had several similar episodes recently though not as severe or prolonged.. Workup with upper abdominal ultrasound showed gallbladder filled with sludge and stones, gallbladder wall 5 mm, CBD of 4.8 mm. Initial labs showed T. bili 2.0/alk phos 290/AST 449/ALT 218  She underwent laparoscopic cholecystectomy yesterday per Dr. Luisa Hart.  There were multiple attempts made at [facility] which was unsuccessful, there was felt to be some leakage from the posterior wall of the cystic duct which was then clipped.  A JP drain was left in place.  She underwent MRI/MRCP this morning which shows bilateral lower lobe atelectasis or infiltrate, small effusions.  There is a small fluid collection in the gallbladder fossa measuring 3.6 x 2.0 cm and some adjacent right upper quadrant inflammatory changes CBD is 5 mm and there is a 2-3 millimeter stone seen in the distal common bile duct, mild bilateral upper quadrant ascites.  Today-WBC 7.6/hemoglobin 12.4 K+3.4 BUN 7/creatinine 0.83 T. bili 1.1/alk phos 253/AST 140/ALT 333  She has been afebrile, and hemodynamically stable.  She still having quite a bit of pain today in the epigastrium and right upper quadrant, no nausea and vomiting and tolerating clear liquids  JP with about 20 cc of clear bloody serous fluid  She is otherwise in good health with history of GERD, anxiety, and colon polyps status post colonoscopy April 2022.  Husband at bedside.   Past Medical History:  Diagnosis Date   Anxiety    GERD  (gastroesophageal reflux disease)    Migraines     Past Surgical History:  Procedure Laterality Date   CHOLECYSTECTOMY N/A 07/17/2022   Procedure: LAPAROSCOPIC CHOLECYSTECTOMY;  Surgeon: Harriette Bouillon, MD;  Location: [facility] OR;  Service: General;  Laterality: N/A;   COLONOSCOPY WITH PROPOFOL N/A 04/18/2020   Procedure: COLONOSCOPY WITH PROPOFOL;  Surgeon: Corbin Ade, MD;  Location: [facility];  Service: Endoscopy;  Laterality: N/A;  AM   INTRAOPERATIVE CHOLANGIOGRAM N/A 07/17/2022   Procedure: INTRAOPERATIVE CHOLANGIOGRAM;  Surgeon: Harriette Bouillon, MD;  Location: [facility] OR;  Service: General;  Laterality: N/A;   none     POLYPECTOMY  04/18/2020   Procedure: POLYPECTOMY;  Surgeon: Corbin Ade, MD;  Location: [facility];  Service: Endoscopy;;    Prior to Admission medications   Medication Sig Start Date End Date Taking? Authorizing Provider  esomeprazole (NEXIUM) 40 MG capsule Take 1 capsule (40 mg total) by mouth daily at 12 noon. 10/30/21  Yes Hawks, Christy A, FNP  naproxen sodium (ALEVE) 220 MG tablet Take 220 mg by mouth 2 (two) times daily as needed (Pain).   Yes [provider]    Current Facility-Administered Medications  Medication Dose Route Frequency Provider Last Rate Last Admin   0.9 %  sodium chloride infusion   Intravenous Continuous Cornett, Thomas, MD 50 mL/hr at 07/17/22 1854 New Bag at 07/17/22 1854   acetaminophen (TYLENOL) tablet 650 mg  650 mg Oral Q6H PRN Cornett, Maisie Fus, MD       Or   acetaminophen (TYLENOL) suppository 650 mg  650 mg Rectal Q6H PRN Harriette Bouillon, MD  cefTRIAXone (ROCEPHIN) 2 g in sodium chloride 0.9 % 100 mL IVPB  2 g Intravenous Q24H Cornett, Thomas, MD 200 mL/hr at 07/18/22 0831 2 g at 07/18/22 0831   diphenhydrAMINE (BENADRYL) capsule 25 mg  25 mg Oral Q6H PRN Cornett, Maisie Fus, MD       Or   diphenhydrAMINE (BENADRYL) injection 25 mg  25 mg Intravenous Q6H PRN Cornett, Thomas, MD       docusate sodium (COLACE) capsule  100 mg  100 mg Oral BID Cornett, Maisie Fus, MD   100 mg at 07/18/22 0828   enoxaparin (LOVENOX) injection 40 mg  40 mg Subcutaneous Q24H Cornett, Maisie Fus, MD   40 mg at 07/18/22 1610   hydrALAZINE (APRESOLINE) injection 10 mg  10 mg Intravenous Q2H PRN Cornett, Maisie Fus, MD       metoprolol tartrate (LOPRESSOR) injection 5 mg  5 mg Intravenous Q6H PRN Cornett, Thomas, MD       morphine (PF) 2 MG/ML injection 1-2 mg  1-2 mg Intravenous Q2H PRN Gaynelle Adu, MD   2 mg at 07/18/22 1139   ondansetron (ZOFRAN-ODT) disintegrating tablet 4 mg  4 mg Oral Q6H PRN Cornett, Maisie Fus, MD   4 mg at 07/16/22 2028   Or   ondansetron (ZOFRAN) injection 4 mg  4 mg Intravenous Q6H PRN Cornett, Thomas, MD       oxyCODONE (Oxy IR/ROXICODONE) immediate release tablet 5-10 mg  5-10 mg Oral Q4H PRN Gaynelle Adu, MD       pantoprazole (PROTONIX) EC tablet 40 mg  40 mg Oral Daily Gaynelle Adu, MD   40 mg at 07/18/22 9604    Allergies as of 07/16/2022   (No Known Allergies)    Family History  Problem Relation Age of Onset   Arthritis Mother    Depression Mother    Hearing loss Mother    Miscarriages / [place] Mother    Alcohol abuse Father    Cancer Father        prostate   Diabetes Father    Heart disease Father    Asthma Maternal Aunt    Asthma Maternal Uncle    Uterine cancer Paternal Grandmother    Stroke Paternal Grandfather    Colon cancer Maternal Uncle     Social History   Socioeconomic History   Marital status: Married    Spouse name: Not on file   Number of children: Not on file   Years of education: Not on file   Highest education level: Not on file  Occupational History   Not on file  Tobacco Use   Smoking status: Never   Smokeless tobacco: Never  Substance and Sexual Activity   Alcohol use: No   Drug use: No   Sexual activity: Not on file  Other Topics Concern   Not on file  Social History Narrative   Not on file   Social Determinants of Health   Financial Resource Strain:  Not on file  Food Insecurity: No Food Insecurity (07/16/2022)   Hunger Vital Sign    Worried About Running Out of Food in the Last Year: Never true    Ran Out of Food in the Last Year: Never true  Transportation Needs: No Transportation Needs (07/16/2022)   PRAPARE - Administrator, Civil Service (Medical): No    Lack of Transportation (Non-Medical): No  Physical Activity: Not on file  Stress: No Stress Concern Present (11/08/2018)   Received from [facility]  [employer] of Occupational Health - Occupational Stress Questionnaire    Feeling of Stress : Not at all  Social Connections: Unknown (05/19/2021)   Received from [facility]   Social Network    Social Network: Not on file  Intimate Partner Violence: Not At Risk (07/16/2022)   Humiliation, Afraid, Rape, and Kick questionnaire    Fear of Current or Ex-Partner: No    Emotionally Abused: No    Physically Abused: No    Sexually Abused: No    Review of Systems: Pertinent positive and negative review of systems were noted in the above HPI section.  All other review of systems was otherwise negative.  Physical Exam: Vital signs in last 24 hours: Temp:  [98.2 F (36.8 C)-98.6 F (37 C)] 98.2 F (36.8 C) (07/13 0727) Pulse Rate:  [48-61] 61 (07/13 0727) Resp:  [18-20] 18 (07/13 0727) BP: (95-138)/(63-72) 138/72 (07/13 0727) SpO2:  [96 %-100 %] 100 % (07/13 0727) Last BM Date : 07/14/22 General:   Alert,  Well-developed, well-nourished, pleasant and cooperative in NAD Head:  Normocephalic and atraumatic. Eyes:  Sclera clear, no icterus.   Conjunctiva pink. Ears:  Normal auditory acuity. Nose:  No deformity, discharge,  or lesions. Mouth:  No deformity or lesions.   Neck:  Supple; no masses or thyromegaly. Lungs:  Clear throughout to auscultation.   No wheezes, crackles, or rhonchi. Heart:  Regular rate and rhythm; no murmurs, clicks, rubs,  or gallops. Abdomen:  Soft,nontender, BS  active,nonpalp mass or hsm.   Rectal:  Deferred  Msk:  Symmetrical without gross deformities. . Pulses:  Normal pulses noted. Extremities:  Without clubbing or edema. Neurologic:  Alert and  oriented x4;  grossly normal neurologically. Skin:  Intact without significant lesions or rashes.. Psych:  Alert and cooperative. Normal mood and affect.  Intake/Output from previous day: 07/12 0701 - 07/13 0700 In: 1000 [I.V.:1000] Out: 85 [Drains:85] Intake/Output this shift: Total I/O In: 360 [P.O.:360] Out: 0   Lab Results: Recent Labs    07/16/22 0252 07/17/22 0348 07/18/22 1149  WBC 7.8 4.6 7.6  HGB 13.7 12.1 12.4  HCT 41.0 36.3 38.3  PLT 226 180 136*   BMET Recent Labs    07/16/22 0252 07/17/22 0348 07/18/22 1149  NA 136 138 137  K 3.6 3.9 3.4*  CL 100 104 104  CO2 22 28 22   GLUCOSE 137* 107* 107*  BUN 6 <5* 7  CREATININE 0.88 0.78 0.83  CALCIUM 9.6 8.9 8.7*   LFT Recent Labs    07/18/22 1149  PROT 5.6*  ALBUMIN 2.9*  AST 140*  ALT 333*  ALKPHOS 253*  BILITOT 1.1   PT/INR No results for input(s): "LABPROT", "INR" in the last 72 hours. Hepatitis Panel No results for input(s): "HEPBSAG", "HCVAB", "HEPAIGM", "HEPBIGM" in the last 72 hours.    IMPRESSION:  #32 57 year old white female admitted on 07/16/2022 with acute right upper quadrant pain radiating into the right shoulder.  Had had several similar but less severe episodes recently. Workup consistent with acute cholecystitis. Elevated LFTs on admission with T. bili 2.0-ultrasound with CBD of 4.8 mm  Patient is status post laparoscopic cholecystectomy yesterday, attempted IOC, not successful She had some leakage from the posterior wall of the cystic duct which was clipped, and JP left in place, not felt to have any persistent bile leakage at the end of the procedure.  MRI/MRCP today, small fluid collection in the gallbladder fossa measuring 3.6 x 2.0 cm and adjacent right upper  quadrant inflammatory  changes, CBD of 5 mm but there is a 2-3 mm stone seen in the distal common bile duct, normal-appearing pancreas  T. bili has trended down to 1.1   #2 bilateral lower lobe atelectasis #3 history of GERD #4.  History of colon polyps-last colonoscopy April 2022 #5 mild hypokalemia-correct    PLAN: Okay for full liquids this evening, n.p.o. after midnight Repeat labs in a.m. Patient has been scheduled for ERCP with stone extraction with Dr. Chales Abrahams tomorrow 07/19/2022.  I discussed the procedure in detail with the patient and her husband, including indications risks and benefits and she is agreeable to proceed. Continue Rocephin   Amy Esterwood PA-C 07/18/2022, 3:29 PM     Attending physician's note   I have taken history, reviewed the chart and examined the patient. I performed a substantive portion of this encounter, including complete performance of at least one of the key components, in conjunction with the APP. I agree with the Advanced Practitioner's note, impression and recommendations.   Choledocholithiasis on MRCP.  No pancreatitis or ascending cholangitis S/P lap chole 7/12, IOC for abn LFTs not successful.  JP drain with nonbilious drainage.  Plan: -Pain control -Trend CBC, CMP, lipase -Continue IV Rocephin -ERCP in AM.   I have discussed risks and benefits with patient and patient's husband in detail including risks of pancreatitis, bleeding, perforation.  Alternatives were also given including watchful waiting.  They wish to proceed.  D/W Dr Natasha Mead, MD [facility] [phone]

## 2022-07-18 NOTE — Plan of Care (Signed)
Problem: Education: Goal: Knowledge of General Education information will improve Description: Including pain rating scale, medication(s)/side effects and non-pharmacologic comfort measures Outcome: Progressing Pt admitted from the ED for epigastric/ RUQ abdominal pain radiating to her mid back.  Per CT of Abdomen and Pelvis pt has possible cholecystitis/ cholelithiasis.    Problem: Clinical Measurements: Goal: Will remain free from infection Outcome: Progressing S/Sx of infection monitored and assessed q-shift.  Pt has remained afebrile thus far.  She is on IV abx per MD's orders.      Problem: Clinical Measurements: Goal: Respiratory complications will improve Outcome: Progressing Respiratory status monitored and assessed q-shift.  Pt is on room air with O2 saturations at 98-100% and respiration rate of 18 breaths per minute.  Pt has not endorsed c/o SOB or DOE.    Problem: Activity: Goal: Risk for activity intolerance will decrease Outcome: Progressing Pt is independent of all her ADLs and can ambulate freely in her room.  She was observed to heave a steady gait.   Problem: Nutrition: Goal: Adequate nutrition will be maintained Outcome: Progressing Pt was advanced from a clear liquid diet to a full liquid diet per MD's orders.  She has been able to tolerate her diet w/o s/sx of abdominal pain/ distention or n/v.  She is NPO after midnight for an ERCP.    Problem: Elimination: Goal: Will not experience complications related to bowel motility Outcome: Progressing Pt stated her LBM was on 07/14/2022.  She is on a bowel regiment per MD's orders.    Problem: Elimination: Goal: Will not experience complications related to urinary retention Outcome: Progressing Pt has been voiding and has not endorsed c/o abdominal pain/ distention or dysuria.    Problem: Pain Managment: Goal: General experience of comfort will improve Outcome: Progressing Pt has c/o 5-9/10 RUQ abdominal pain  describing the pain as an ache.  Reiterated pain scale so she could adequately rate her pain.  Pt stated her pain goal would be 3/10.  Discussed nonpharmacological methods to help reduce s/sx of pain.  Interventions given per pt's request and MD's orders.                  Problem: Skin Integrity: Goal: Risk for impaired skin integrity will decrease Outcome: Progressing Skin integrity monitored and assessed q-shift.  Instructed pt to turn/ reposition herself q2 hours to prevent further skin impairment.  Tubes and drains assessed for device related pressure sore.  Pt is continent of both bowel and bladder.  Dressing changes performed per MD's orders.    Problem: Safety: Goal: Ability to remain free from injury will improve Outcome: Progressing Pt has remained free from falls thus far.  Instructed pt and her family to utilize RN call light for assistance.  Hourly rounds performed.  Bed in lowest position, locked with two upper side rails engaged.  Belongings and call light within reach.

## 2022-07-18 NOTE — Consult Note (Addendum)
Consultation  Referring Provider: [facility]/Dr. Andrey Campanile Primary Care Physician:  Junie Spencer, FNP Primary Gastroenterologist: Merrie Roof Dr. Jena Gauss  Reason for Consultation: Choledocholithiasis  HPI: Amy Stout is a 57 y.o. female who was admitted through the emergency room on 07/16/2022 when she presented with acute abdominal pain located mainly in the right upper quadrant and radiating to the right shoulder.  Per the notes she had had several similar episodes recently though not as severe or prolonged.. Workup with upper abdominal ultrasound showed gallbladder filled with sludge and stones, gallbladder wall 5 mm, CBD of 4.8 mm. Initial labs showed T. bili 2.0/alk phos 290/AST 449/ALT 218  She underwent laparoscopic cholecystectomy yesterday per Dr. Luisa Hart.  There were multiple attempts made at [facility] which was unsuccessful, there was felt to be some leakage from the posterior wall of the cystic duct which was then clipped.  A JP drain was left in place.  She underwent MRI/MRCP this morning which shows bilateral lower lobe atelectasis or infiltrate, small effusions.  There is a small fluid collection in the gallbladder fossa measuring 3.6 x 2.0 cm and some adjacent right upper quadrant inflammatory changes CBD is 5 mm and there is a 2-3 millimeter stone seen in the distal common bile duct, mild bilateral upper quadrant ascites.  Today-WBC 7.6/hemoglobin 12.4 K+3.4 BUN 7/creatinine 0.83 T. bili 1.1/alk phos 253/AST 140/ALT 333  She has been afebrile, and hemodynamically stable.  She still having quite a bit of pain today in the epigastrium and right upper quadrant, no nausea and vomiting and tolerating clear liquids  JP with about 20 cc of clear bloody serous fluid  She is otherwise in good health with history of GERD, anxiety, and colon polyps status post colonoscopy April 2022.  Husband at bedside.   Past Medical History:  Diagnosis Date   Anxiety    GERD  (gastroesophageal reflux disease)    Migraines     Past Surgical History:  Procedure Laterality Date   CHOLECYSTECTOMY N/A 07/17/2022   Procedure: LAPAROSCOPIC CHOLECYSTECTOMY;  Surgeon: Harriette Bouillon, MD;  Location: [facility] OR;  Service: General;  Laterality: N/A;   COLONOSCOPY WITH PROPOFOL N/A 04/18/2020   Procedure: COLONOSCOPY WITH PROPOFOL;  Surgeon: Corbin Ade, MD;  Location: [facility] SUITE;  Service: Endoscopy;  Laterality: N/A;  AM   INTRAOPERATIVE CHOLANGIOGRAM N/A 07/17/2022   Procedure: INTRAOPERATIVE CHOLANGIOGRAM;  Surgeon: Harriette Bouillon, MD;  Location: [facility] OR;  Service: General;  Laterality: N/A;   none     POLYPECTOMY  04/18/2020   Procedure: POLYPECTOMY;  Surgeon: Corbin Ade, MD;  Location: [facility] SU[facility];  Service: Endoscopy;;    Prior to Admission medications   Medication Sig Start Date End Date Taking? Authorizing Provider  esomeprazole (NEXIUM) 40 MG capsule Take 1 capsule (40 mg total) by mouth daily at 12 noon. 10/30/21  Yes Hawks, Christy A, FNP  naproxen sodium (ALEVE) 220 MG tablet Take 220 mg by mouth 2 (two) times daily as needed (Pain).   Yes [provider]    Current Facility-Administered Medications  Medication Dose Route Frequency Provider Last Rate Last Admin   0.9 %  sodium chloride infusion   Intravenous Continuous Cornett, Thomas, MD 50 mL/hr at 07/17/22 1854 New Bag at 07/17/22 1854   acetaminophen (TYLENOL) tablet 650 mg  650 mg Oral Q6H PRN Cornett, Maisie Fus, MD       Or   acetaminophen (TYLENOL) suppository 650 mg  650 mg Rectal Q6H PRN Harriette Bouillon, MD  cefTRIAXone (ROCEPHIN) 2 g in sodium chloride 0.9 % 100 mL IVPB  2 g Intravenous Q24H Cornett, Thomas, MD 200 mL/hr at 07/18/22 0831 2 g at 07/18/22 0831   diphenhydrAMINE (BENADRYL) capsule 25 mg  25 mg Oral Q6H PRN Cornett, Maisie Fus, MD       Or   diphenhydrAMINE (BENADRYL) injection 25 mg  25 mg Intravenous Q6H PRN Cornett, Thomas, MD       docusate sodium (COLACE) capsule  100 mg  100 mg Oral BID Cornett, Maisie Fus, MD   100 mg at 07/18/22 0828   enoxaparin (LOVENOX) injection 40 mg  40 mg Subcutaneous Q24H Cornett, Maisie Fus, MD   40 mg at 07/18/22 1610   hydrALAZINE (APRESOLINE) injection 10 mg  10 mg Intravenous Q2H PRN Cornett, Maisie Fus, MD       metoprolol tartrate (LOPRESSOR) injection 5 mg  5 mg Intravenous Q6H PRN Cornett, Thomas, MD       morphine (PF) 2 MG/ML injection 1-2 mg  1-2 mg Intravenous Q2H PRN Gaynelle Adu, MD   2 mg at 07/18/22 1139   ondansetron (ZOFRAN-ODT) disintegrating tablet 4 mg  4 mg Oral Q6H PRN Cornett, Maisie Fus, MD   4 mg at 07/16/22 2028   Or   ondansetron (ZOFRAN) injection 4 mg  4 mg Intravenous Q6H PRN Cornett, Thomas, MD       oxyCODONE (Oxy IR/ROXICODONE) immediate release tablet 5-10 mg  5-10 mg Oral Q4H PRN Gaynelle Adu, MD       pantoprazole (PROTONIX) EC tablet 40 mg  40 mg Oral Daily Gaynelle Adu, MD   40 mg at 07/18/22 9604    Allergies as of 07/16/2022   (No Known Allergies)    Family History  Problem Relation Age of Onset   Arthritis Mother    Depression Mother    Hearing loss Mother    Miscarriages / [place] Mother    Alcohol abuse Father    Cancer Father        prostate   Diabetes Father    Heart disease Father    Asthma Maternal Aunt    Asthma Maternal Uncle    Uterine cancer Paternal Grandmother    Stroke Paternal Grandfather    Colon cancer Maternal Uncle     Social History   Socioeconomic History   Marital status: Married    Spouse name: Not on file   Number of children: Not on file   Years of education: Not on file   Highest education level: Not on file  Occupational History   Not on file  Tobacco Use   Smoking status: Never   Smokeless tobacco: Never  Substance and Sexual Activity   Alcohol use: No   Drug use: No   Sexual activity: Not on file  Other Topics Concern   Not on file  Social History Narrative   Not on file   Social Determinants of Health   Financial Resource Strain:  Not on file  Food Insecurity: No Food Insecurity (07/16/2022)   Hunger Vital Sign    Worried About Running Out of Food in the Last Year: Never true    Ran Out of Food in the Last Year: Never true  Transportation Needs: No Transportation Needs (07/16/2022)   PRAPARE - Administrator, Civil Service (Medical): No    Lack of Transportation (Non-Medical): No  Physical Activity: Not on file  Stress: No Stress Concern Present (11/08/2018)   Received from [facility]  [employer] of Occupational Health - Occupational Stress Questionnaire    Feeling of Stress : Not at all  Social Connections: Unknown (05/19/2021)   Received from [facility]   Social Network    Social Network: Not on file  Intimate Partner Violence: Not At Risk (07/16/2022)   Humiliation, Afraid, Rape, and Kick questionnaire    Fear of Current or Ex-Partner: No    Emotionally Abused: No    Physically Abused: No    Sexually Abused: No    Review of Systems: Pertinent positive and negative review of systems were noted in the above HPI section.  All other review of systems was otherwise negative.  Physical Exam: Vital signs in last 24 hours: Temp:  [98.2 F (36.8 C)-98.6 F (37 C)] 98.2 F (36.8 C) (07/13 0727) Pulse Rate:  [48-61] 61 (07/13 0727) Resp:  [18-20] 18 (07/13 0727) BP: (95-138)/(63-72) 138/72 (07/13 0727) SpO2:  [96 %-100 %] 100 % (07/13 0727) Last BM Date : 07/14/22 General:   Alert,  Well-developed, well-nourished, pleasant and cooperative in NAD Head:  Normocephalic and atraumatic. Eyes:  Sclera clear, no icterus.   Conjunctiva pink. Ears:  Normal auditory acuity. Nose:  No deformity, discharge,  or lesions. Mouth:  No deformity or lesions.   Neck:  Supple; no masses or thyromegaly. Lungs:  Clear throughout to auscultation.   No wheezes, crackles, or rhonchi. Heart:  Regular rate and rhythm; no murmurs, clicks, rubs,  or gallops. Abdomen:  Soft,nontender, BS  active,nonpalp mass or hsm.   Rectal:  Deferred  Msk:  Symmetrical without gross deformities. . Pulses:  Normal pulses noted. Extremities:  Without clubbing or edema. Neurologic:  Alert and  oriented x4;  grossly normal neurologically. Skin:  Intact without significant lesions or rashes.. Psych:  Alert and cooperative. Normal mood and affect.  Intake/Output from previous day: 07/12 0701 - 07/13 0700 In: 1000 [I.V.:1000] Out: 85 [Drains:85] Intake/Output this shift: Total I/O In: 360 [P.O.:360] Out: 0   Lab Results: Recent Labs    07/16/22 0252 07/17/22 0348 07/18/22 1149  WBC 7.8 4.6 7.6  HGB 13.7 12.1 12.4  HCT 41.0 36.3 38.3  PLT 226 180 136*   BMET Recent Labs    07/16/22 0252 07/17/22 0348 07/18/22 1149  NA 136 138 137  K 3.6 3.9 3.4*  CL 100 104 104  CO2 22 28 22   GLUCOSE 137* 107* 107*  BUN 6 <5* 7  CREATININE 0.88 0.78 0.83  CALCIUM 9.6 8.9 8.7*   LFT Recent Labs    07/18/22 1149  PROT 5.6*  ALBUMIN 2.9*  AST 140*  ALT 333*  ALKPHOS 253*  BILITOT 1.1   PT/INR No results for input(s): "LABPROT", "INR" in the last 72 hours. Hepatitis Panel No results for input(s): "HEPBSAG", "HCVAB", "HEPAIGM", "HEPBIGM" in the last 72 hours.    IMPRESSION:  #32 57 year old white female admitted on 07/16/2022 with acute right upper quadrant pain radiating into the right shoulder.  Had had several similar but less severe episodes recently. Workup consistent with acute cholecystitis. Elevated LFTs on admission with T. bili 2.0-ultrasound with CBD of 4.8 mm  Patient is status post laparoscopic cholecystectomy yesterday, attempted IOC, not successful She had some leakage from the posterior wall of the cystic duct which was clipped, and JP left in place, not felt to have any persistent bile leakage at the end of the procedure.  MRI/MRCP today, small fluid collection in the gallbladder fossa measuring 3.6 x 2.0 cm and adjacent right upper  quadrant inflammatory  changes, CBD of 5 mm but there is a 2-3 mm stone seen in the distal common bile duct, normal-appearing pancreas  T. bili has trended down to 1.1   #2 bilateral lower lobe atelectasis #3 history of GERD #4.  History of colon polyps-last colonoscopy April 2022 #5 mild hypokalemia-correct    PLAN: Okay for full liquids this evening, n.p.o. after midnight Repeat labs in a.m. Patient has been scheduled for ERCP with stone extraction with Dr. Chales Abrahams tomorrow 07/19/2022.  I discussed the procedure in detail with the patient and her husband, including indications risks and benefits and she is agreeable to proceed. Continue Rocephin   Amy Esterwood PA-C 07/18/2022, 3:29 PM     Attending physician's note   I have taken history, reviewed the chart and examined the patient. I performed a substantive portion of this encounter, including complete performance of at least one of the key components, in conjunction with the APP. I agree with the Advanced Practitioner's note, impression and recommendations.   Choledocholithiasis on MRCP.  No pancreatitis or ascending cholangitis S/P lap chole 7/12, IOC for abn LFTs not successful.  JP drain with nonbilious drainage.  Plan: -Pain control -Trend CBC, CMP, lipase -Continue IV Rocephin -ERCP in AM.   I have discussed risks and benefits with patient and patient's husband in detail including risks of pancreatitis, bleeding, perforation.  Alternatives were also given including watchful waiting.  They wish to proceed.  D/W Dr Natasha Mead, MD [facility] [phone]

## 2022-07-19 ENCOUNTER — Inpatient Hospital Stay (HOSPITAL_COMMUNITY): Payer: BC Managed Care – PPO | Admitting: Certified Registered Nurse Anesthetist

## 2022-07-19 ENCOUNTER — Encounter (HOSPITAL_COMMUNITY): Admission: EM | Disposition: A | Discharge status: Home / Self Care | Payer: Self-pay | Source: Home / Self Care

## 2022-07-19 ENCOUNTER — Encounter (HOSPITAL_COMMUNITY): Payer: Self-pay

## 2022-07-19 ENCOUNTER — Inpatient Hospital Stay (HOSPITAL_COMMUNITY): Payer: BC Managed Care – PPO

## 2022-07-19 DIAGNOSIS — R748 Abnormal levels of other serum enzymes: Secondary | ICD-10-CM

## 2022-07-19 DIAGNOSIS — K8050 Calculus of bile duct without cholangitis or cholecystitis without obstruction: Secondary | ICD-10-CM | POA: Diagnosis not present

## 2022-07-19 HISTORY — PX: REMOVAL OF STONES: SHX5545

## 2022-07-19 HISTORY — PX: SPHINCTEROTOMY: SHX5279

## 2022-07-19 HISTORY — PX: ERCP: SHX5425

## 2022-07-19 LAB — COMPREHENSIVE METABOLIC PANEL
ALT: 313 U/L — ABNORMAL HIGH (ref 0–44)
AST: 167 U/L — ABNORMAL HIGH (ref 15–41)
Albumin: 2.8 g/dL — ABNORMAL LOW (ref 3.5–5.0)
Alkaline Phosphatase: 259 U/L — ABNORMAL HIGH (ref 38–126)
Anion gap: 7 (ref 5–15)
BUN: 5 mg/dL — ABNORMAL LOW (ref 6–20)
CO2: 28 mmol/L (ref 22–32)
Calcium: 8.6 mg/dL — ABNORMAL LOW (ref 8.9–10.3)
Chloride: 104 mmol/L (ref 98–111)
Creatinine, Ser: 0.79 mg/dL (ref 0.44–1.00)
GFR, Estimated: >60 mL/min (ref >60)
Glucose, Bld: 109 mg/dL — ABNORMAL HIGH (ref 70–99)
Potassium: 3.3 mmol/L — ABNORMAL LOW (ref 3.5–5.1)
Sodium: 139 mmol/L (ref 135–145)
Total Bilirubin: 2.3 mg/dL — ABNORMAL HIGH (ref 0.3–1.2)
Total Protein: 5.5 g/dL — ABNORMAL LOW (ref 6.5–8.1)

## 2022-07-19 LAB — CBC
HCT: 37.5 % (ref 36.0–46.0)
Hemoglobin: 12 g/dL (ref 12.0–15.0)
MCH: 28.4 pg (ref 26.0–34.0)
MCHC: 32 g/dL (ref 30.0–36.0)
MCV: 88.7 fL (ref 80.0–100.0)
Platelets: 151 10*3/uL (ref 150–400)
RBC: 4.23 MIL/uL (ref 3.87–5.11)
RDW: 13.1 % (ref 11.5–15.5)
WBC: 5.3 10*3/uL (ref 4.0–10.5)
nRBC: 0 % (ref 0.0–0.2)

## 2022-07-19 LAB — LIPASE, BLOOD: Lipase: 31 U/L (ref 11–51)

## 2022-07-19 SURGERY — ERCP, WITH INTERVENTION IF INDICATED
Anesthesia: General

## 2022-07-19 MED ORDER — SIMETHICONE 80 MG PO CHEW: 80.0000 mg | CHEWABLE_TABLET | Freq: Four times a day (QID) | ORAL | Status: DC | PRN

## 2022-07-19 MED ORDER — LIDOCAINE 2% (20 MG/ML) 5 ML SYRINGE
INTRAMUSCULAR | Status: DC | PRN
  Administered 2022-07-19: 60 mg via INTRAVENOUS

## 2022-07-19 MED ORDER — MIDAZOLAM HCL 2 MG/2ML IJ SOLN
INTRAMUSCULAR | Status: DC | PRN
  Administered 2022-07-19: 2 mg via INTRAVENOUS

## 2022-07-19 MED ORDER — ONDANSETRON HCL 4 MG/2ML IJ SOLN
INTRAMUSCULAR | Status: DC | PRN
  Administered 2022-07-19: 4 mg via INTRAVENOUS

## 2022-07-19 MED ORDER — METHOCARBAMOL 750 MG PO TABS
750.0000 mg | ORAL_TABLET | Freq: Three times a day (TID) | ORAL | Status: DC
  Administered 2022-07-19 – 2022-07-20 (×4): 750 mg via ORAL
  Filled 2022-07-19 (×4): qty 1

## 2022-07-19 MED ORDER — POLYETHYLENE GLYCOL 3350 17 G PO PACK
17.0000 g | PACK | Freq: Every day | ORAL | Status: DC
  Administered 2022-07-20: 17 g via ORAL
  Filled 2022-07-19: qty 1

## 2022-07-19 MED ORDER — DICLOFENAC SUPPOSITORY 100 MG
RECTAL | Status: DC | PRN
  Administered 2022-07-19: 100 mg via RECTAL

## 2022-07-19 MED ORDER — SUGAMMADEX SODIUM 200 MG/2ML IV SOLN
INTRAVENOUS | Status: DC | PRN
  Administered 2022-07-19: 200 mg via INTRAVENOUS

## 2022-07-19 MED ORDER — DICLOFENAC SUPPOSITORY 100 MG
RECTAL | Status: AC
  Filled 2022-07-19: qty 1

## 2022-07-19 MED ORDER — SODIUM CHLORIDE 0.9 % IV SOLN
1.5000 g | Freq: Once | INTRAVENOUS | Status: DC
  Filled 2022-07-19: qty 4

## 2022-07-19 MED ORDER — INDOMETHACIN 50 MG RE SUPP: 100.0000 mg | Freq: Once | RECTAL | Status: DC

## 2022-07-19 MED ORDER — PROPOFOL 10 MG/ML IV BOLUS
INTRAVENOUS | Status: DC | PRN
  Administered 2022-07-19: 130 mg via INTRAVENOUS

## 2022-07-19 MED ORDER — DEXAMETHASONE SODIUM PHOSPHATE 10 MG/ML IJ SOLN
INTRAMUSCULAR | Status: DC | PRN
  Administered 2022-07-19: 4 mg via INTRAVENOUS

## 2022-07-19 MED ORDER — SODIUM CHLORIDE 0.9 % IV SOLN
INTRAVENOUS | Status: DC | PRN
  Administered 2022-07-19: 90 mL

## 2022-07-19 MED ORDER — LACTATED RINGERS IV SOLN: INTRAVENOUS | Status: DC | PRN

## 2022-07-19 MED ORDER — CIPROFLOXACIN IN D5W 400 MG/200ML IV SOLN
INTRAVENOUS | Status: AC
  Filled 2022-07-19: qty 200

## 2022-07-19 MED ORDER — GLUCAGON HCL RDNA (DIAGNOSTIC) 1 MG IJ SOLR
INTRAMUSCULAR | Status: DC | PRN
  Administered 2022-07-19 (×2): .25 mg via INTRAVENOUS

## 2022-07-19 MED ORDER — ROCURONIUM BROMIDE 10 MG/ML (PF) SYRINGE
PREFILLED_SYRINGE | INTRAVENOUS | Status: DC | PRN
  Administered 2022-07-19: 40 mg via INTRAVENOUS

## 2022-07-19 MED ORDER — GLUCAGON HCL RDNA (DIAGNOSTIC) 1 MG IJ SOLR
INTRAMUSCULAR | Status: AC
  Filled 2022-07-19: qty 1

## 2022-07-19 NOTE — Progress Notes (Signed)
2 Days Post-Op  Subjective: CC: Sore around her incisions. Will get a grabbing pain in her ruq periodically. Back pain and nausea after eating soup last night. Currently npo for ercp today. No grabbing pain, back pain, n/v today. No flatus or bm. Voiding without issues. Mobilizing.   Afebrile. No tachycardia or hypotension. Labs with normal WBC this am and rising T. Bili  Objective: Vital signs in last 24 hours: Temp:  [97.5 F (36.4 C)-98.9 F (37.2 C)] 98.2 F (36.8 C) (07/14 0745) Pulse Rate:  [50-58] 53 (07/14 0745) Resp:  [16-18] 16 (07/14 0745) BP: (105-132)/(67-80) 110/67 (07/14 0745) SpO2:  [96 %-98 %] 96 % (07/14 0745) Last BM Date : 07/14/22  Intake/Output from previous day: 07/13 0701 - 07/14 0700 In: 818.3 [P.O.:600; IV Piggyback:218.3] Out: 950 [Urine:700; Drains:200] Intake/Output this shift: No intake/output data recorded.  PE:  Gen:  Alert, NAD, pleasant Card:  Reg Pulm:  CTAB, no W/R/R, effort normal Abd: Soft,  distension, appropriately tender around laparoscopic incisions, no rigidity or guarding and otherwise NT, +BS. Incisions with glue intact appears well and are without drainage, bleeding, or signs of infection. JP drain SS.  Psych: A&Ox3   Lab Results:  Recent Labs    07/18/22 1149 07/19/22 0150  WBC 7.6 5.3  HGB 12.4 12.0  HCT 38.3 37.5  PLT 136* 151   BMET Recent Labs    07/18/22 1149 07/19/22 0150  NA 137 139  K 3.4* 3.3*  CL 104 104  CO2 22 28  GLUCOSE 107* 109*  BUN 7 5*  CREATININE 0.83 0.79  CALCIUM 8.7* 8.6*   PT/INR No results for input(s): "LABPROT", "INR" in the last 72 hours. CMP     Component Value Date/Time   NA 139 07/19/2022 0150   NA 142 10/30/2021 1250   K 3.3 (L) 07/19/2022 0150   CL 104 07/19/2022 0150   CO2 28 07/19/2022 0150   GLUCOSE 109 (H) 07/19/2022 0150   BUN 5 (L) 07/19/2022 0150   BUN 11 10/30/2021 1250   CREATININE 0.79 07/19/2022 0150   CALCIUM 8.6 (L) 07/19/2022 0150   PROT 5.5 (L)  07/19/2022 0150   PROT 7.3 10/30/2021 1250   ALBUMIN 2.8 (L) 07/19/2022 0150   ALBUMIN 4.5 10/30/2021 1250   AST 167 (H) 07/19/2022 0150   ALT 313 (H) 07/19/2022 0150   ALKPHOS 259 (H) 07/19/2022 0150   BILITOT 2.3 (H) 07/19/2022 0150   BILITOT 0.3 10/30/2021 1250   GFRNONAA >60 07/19/2022 0150   GFRAA 100 11/21/2019 1621   Lipase     Component Value Date/Time   LIPASE 31 07/19/2022 0150    Studies/Results: MR ABDOMEN MRCP W WO CONTAST  Result Date: 07/18/2022 CLINICAL DATA:  Jaundice. Postop from laparoscopic cholecystectomy yesterday. EXAM: MRI ABDOMEN WITHOUT AND WITH CONTRAST (INCLUDING MRCP) TECHNIQUE: Multiplanar multisequence MR imaging of the abdomen was performed both before and after the administration of intravenous contrast. Heavily T2-weighted images of the biliary and pancreatic ducts were obtained, and three-dimensional MRCP images were rendered by post processing. CONTRAST:  7.7mL GADAVIST GADOBUTROL 1 MMOL/ML IV SOLN COMPARISON:  None Available. FINDINGS: Lower chest: Bilateral lower lobe atelectasis or infiltrates and tiny bilateral pleural effusions. Hepatobiliary: No hepatic masses identified. Postop changes are seen from recent cholecystectomy. Small fluid collection is seen in the gallbladder fossa measuring 3.6 x 2.0 cm. Adjacent right upper quadrant inflammatory changes are also seen. No evidence of biliary ductal dilatation with common bile duct measuring 5 mm. A  2-3 mm filling defect is seen in the distal common bile duct, consistent with a tiny calculus. Pancreas: No mass or inflammatory changes. No evidence of pancreatic ductal dilatation or pancreas divisum. Spleen:  Within normal limits in size and appearance. Adrenals/Urinary Tract: No suspicious masses identified. No evidence of hydronephrosis. Stomach/Bowel: Unremarkable. Vascular/Lymphatic: No pathologically enlarged lymph nodes identified. No acute vascular findings. Other:  Mild perihepatic and splenic  ascites. Musculoskeletal:  No suspicious bone lesions identified. IMPRESSION: Recent postop changes from cholecystectomy. Small fluid collection in the gallbladder fossa, which may represent a postoperative seroma, although biloma due to bile leak cannot be excluded. Consider nuclear medicine HIDA scan if clinically warranted. 2-3 mm calculus in distal common bile duct. No evidence of biliary ductal dilatation. Mild bilateral upper quadrant ascites. Bilateral lower lobe atelectasis or infiltrates, and tiny bilateral pleural effusions. Electronically Signed   By: Danae Orleans M.D.   On: 07/18/2022 12:09   DG C-Arm 1-60 Min-No Report  Result Date: 07/17/2022 Fluoroscopy was utilized by the requesting physician.  No radiographic interpretation.    Anti-infectives: Anti-infectives (From admission, onward)    Start     Dose/Rate Route Frequency Ordered Stop   07/16/22 0815  cefTRIAXone (ROCEPHIN) 2 g in sodium chloride 0.9 % 100 mL IVPB        2 g 200 mL/hr over 30 Minutes Intravenous Every 24 hours 07/16/22 0804 07/23/22 0814        Assessment/Plan POD 2 s/p Laparoscopic Cholecystectomy for Acute Cholecystitis by Dr. Luisa Hart on 07/17/22 - Noted rising T. Bili to 3.3 yesterday pre-op. IOC unable to be done intra-op. MRCP positive for choledocholithiasis. T. Bili up this am. GI planning ERCP today. AM labs written for.  - Cont JP drain, currently SS - Adjusted pain meds - Mobilize, pulm toilet  FEN - NPO. Okay for diet after ERCP. Bowel regimen. IVF at 7ml/hr while NPO.  VTE - SCDs, Lovenox  ID - Rocephin written for 7d   LOS: 1 day    Jacinto Halim , [facility] 07/19/2022, 8:50 AM Please see Amion for pager number during day hours 7:00am-4:30pm

## 2022-07-19 NOTE — Interval H&P Note (Signed)
History and Physical Interval Note:  07/19/2022 12:22 PM  [name]  has presented today for surgery, with the diagnosis of CBD stone.  The various methods of treatment have been discussed with the patient and family. After consideration of risks, benefits and other options for treatment, the patient has consented to  Procedure(s): ENDOSCOPIC RETROGRADE CHOLANGIOPANCREATOGRAPHY (ERCP) (N/A) as a surgical intervention.  The patient's history has been reviewed, patient examined, no change in status, stable for surgery.  I have reviewed the patient's chart and labs.  Questions were answered to the patient's satisfaction.     Lynann Bologna

## 2022-07-19 NOTE — Plan of Care (Signed)
Patient alert/oriented X4. Patient compliant with medication administration and given morphine as needed for pain. JP drain dressing intact and put out 75mL of serosanguinous output. Patient VSS, will continue to monitor.   Problem: Education: Goal: Knowledge of General Education information will improve Description: Including pain rating scale, medication(s)/side effects and non-pharmacologic comfort measures Outcome: Progressing   Problem: Health Behavior/Discharge Planning: Goal: Ability to manage health-related needs will improve Outcome: Progressing   Problem: Clinical Measurements: Goal: Ability to maintain clinical measurements within normal limits will improve Outcome: Progressing   Problem: Clinical Measurements: Goal: Will remain free from infection Outcome: Progressing   Problem: Clinical Measurements: Goal: Diagnostic test results will improve Outcome: Progressing   Problem: Clinical Measurements: Goal: Respiratory complications will improve Outcome: Progressing   Problem: Clinical Measurements: Goal: Cardiovascular complication will be avoided Outcome: Progressing   Problem: Activity: Goal: Risk for activity intolerance will decrease Outcome: Progressing   Problem: Nutrition: Goal: Adequate nutrition will be maintained Outcome: Progressing   Problem: Coping: Goal: Level of anxiety will decrease Outcome: Progressing   Problem: Elimination: Goal: Will not experience complications related to bowel motility Outcome: Progressing   Problem: Elimination: Goal: Will not experience complications related to urinary retention Outcome: Progressing   Problem: Pain Managment: Goal: General experience of comfort will improve Outcome: Progressing   Problem: Safety: Goal: Ability to remain free from injury will improve Outcome: Progressing   Problem: Skin Integrity: Goal: Risk for impaired skin integrity will decrease Outcome: Progressing

## 2022-07-19 NOTE — Anesthesia Preprocedure Evaluation (Addendum)
Anesthesia Evaluation  Patient identified by MRN, date of birth, ID band Patient awake    Reviewed: Allergy & Precautions, H&P , NPO status , Patient's Chart, lab work & pertinent test results  Airway Mallampati: III  TM Distance: >3 FB Neck ROM: Full    Dental no notable dental hx. (+) Teeth Intact, Dental Advisory Given   Pulmonary neg pulmonary ROS   Pulmonary exam normal breath sounds clear to auscultation       Cardiovascular negative cardio ROS  Rhythm:Regular Rate:Normal     Neuro/Psych  Headaches  Anxiety     negative neurological ROS  negative psych ROS   GI/Hepatic Neg liver ROS,GERD  Medicated,,  Endo/Other  negative endocrine ROS    Renal/GU negative Renal ROS  negative genitourinary   Musculoskeletal   Abdominal   Peds  Hematology negative hematology ROS (+)   Anesthesia Other Findings   Reproductive/Obstetrics negative OB ROS                             Anesthesia Physical Anesthesia Plan  ASA: 2  Anesthesia Plan: General   Post-op Pain Management: Minimal or no pain anticipated   Induction: Intravenous  PONV Risk Score and Plan: 3 and Ondansetron, Dexamethasone and Midazolam  Airway Management Planned: Oral ETT  Additional Equipment:   Intra-op Plan:   Post-operative Plan: Extubation in OR  Informed Consent: I have reviewed the patients History and Physical, chart, labs and discussed the procedure including the risks, benefits and alternatives for the proposed anesthesia with the patient or authorized representative who has indicated his/her understanding and acceptance.     Dental advisory given  Plan Discussed with: CRNA  Anesthesia Plan Comments:        Anesthesia Quick Evaluation

## 2022-07-19 NOTE — Op Note (Signed)
[facility] Patient Name: Amy Stout Procedure Date : 07/19/2022 MRN: [id number] Attending MD: Lynann Bologna , MD, [id number] Date of Birth: [birth year] CSN: [id number] Age: 57 Admit Type: Inpatient Procedure:                ERCP Indications:              Common bile duct stone(s) on MRCP with abn LFTs.                            S/P cholecystectomy 7/12 Providers:                Lynann Bologna, MD, Eliberto Ivory, RN, Marja Kays,                            Technician Referring MD:              Medicines:                Monitored Anesthesia Care, patient already on IV                            Rocephin. Glucagon 0.5 mg, Indocin suppositories                            100 mg PR Complications:            No immediate complications. Estimated Blood Loss:     Estimated blood loss: none. Procedure:                Pre-Anesthesia Assessment:                           - Prior to the procedure, a History and Physical                            was performed, and patient medications and                            allergies were reviewed. The patient's tolerance of                            previous anesthesia was also reviewed. The risks                            and benefits of the procedure and the sedation                            options and risks were discussed with the patient.                            All questions were answered, and informed consent                            was obtained. Prior Anticoagulants: The patient has  taken no anticoagulant or antiplatelet agents. ASA                            Grade Assessment: II - A patient with mild systemic                            disease. After reviewing the risks and benefits,                            the patient was deemed in satisfactory condition to                            undergo the procedure.                           After obtaining informed consent, the scope was                             passed under direct vision. Throughout the                            procedure, the patient's blood pressure, pulse, and                            oxygen saturations were monitored continuously. The                            Duodenoscope was introduced through the mouth, and                            used to inject contrast into and used to inject                            contrast into the bile duct. The ERCP was                            accomplished without difficulty. The patient                            tolerated the procedure well. Scope In: Scope Out: Findings:      A scout film of the abdomen was obtained. Surgical clips, consistent       with a previous cholecystectomy, were seen in the area of the right       upper quadrant of the abdomen. The esophagus was successfully intubated       under direct vision. The scope was advanced to a normal major papilla in       the descending duodenum without detailed examination of the pharynx,       larynx and associated structures, and upper GI tract. The upper GI tract       was grossly normal.      The bile duct was deeply cannulated with the short-nosed traction       sphincterotome using a wire-guided approach on first attempt. One small       6 mm filling  defect c/w choledocholithiasis was visualized in the lower       CBD with mild dilatation of the entire biliary system. There was       evidence of previous cholecystectomy. No leaks. An 8 mm biliary       sphincterotomy was made with a monofilament traction (standard)       sphincterotome using ERBE electrocautery endocut mode at 12 o'clock       position. There was no post-sphincterotomy bleeding. The biliary tree       was swept with a 10 mm balloon starting at the bifurcation with       resultant removal of a single CBD stone with some sludge (see endoscopic       photos). Postocclusion cholangiogram did not reveal any residual filling       defects.  There was pruning of the right hepatic system. It seems that       she has more prominent left hepatic duct with a right anterior duct       coming off the left system.      Pancreatic duct was intentionally not cannulated or injected. Indocin       suppositories were given prior to insertion of the scope. Impression:               - Choledocholithiasis s/p biliary sphincterotomy                            and balloon extraction.                           - S/P cholecystectomy. Recommendation:           - Return patient to hospital ward for ongoing care.                           - Clear liquid diet. Advance diet as tolerated                           - Trend LFTs. They are expected to increase for few                            days before settling down.                           - Watch for pancreatitis, bleeding, perforation,                            and cholangitis.                           - The findings and recommendations were discussed                            with the patient's family. Procedure Code(s):        --- Professional ---                           [phone], Endoscopic retrograde  cholangiopancreatography (ERCP); with removal of                            calculi/debris from biliary/pancreatic duct(s)                           [phone], Endoscopic retrograde                            cholangiopancreatography (ERCP); with                            sphincterotomy/papillotomy                           [phone], Endoscopic catheterization of the biliary                            ductal system, radiological supervision and                            interpretation Diagnosis Code(s):        --- Professional ---                           K80.50, Calculus of bile duct without cholangitis                            or cholecystitis without obstruction CPT copyright 2022 [employer]. All rights reserved. The codes documented in this  report are preliminary and upon coder review may  be revised to meet current compliance requirements. Lynann Bologna, MD 07/19/2022 1:32:25 PM This report has been signed electronically. Number of Addenda: 0

## 2022-07-19 NOTE — Transfer of Care (Signed)
Immediate Anesthesia Transfer of Care Note  Patient: Amy Stout  Procedure(s) Performed: ENDOSCOPIC RETROGRADE CHOLANGIOPANCREATOGRAPHY (ERCP) SPHINCTEROTOMY REMOVAL OF STONES  Patient Location: PACU  Anesthesia Type:General  Level of Consciousness: drowsy, patient cooperative, and responds to stimulation  Airway & Oxygen Therapy: Patient Spontanous Breathing  Post-op Assessment: Report given to RN and Post -op Vital signs reviewed and stable  Post vital signs: Reviewed and stable  Last Vitals:  Vitals Value Taken Time  BP 129/79 07/19/22 1350  Temp 36.6 C 07/19/22 1332  Pulse 58 07/19/22 1355  Resp 15 07/19/22 1357  SpO2 94 % 07/19/22 1355  Vitals shown include unfiled device data.  Last Pain:  Vitals:   07/19/22 1340  TempSrc:   PainSc: 0-No pain      Patients Stated Pain Goal: 0 (07/18/22 1945)  Complications:  Encounter Notable Events  Notable Event Outcome Phase Comment  Difficult to intubate - expected  Intraprocedure Filed from anesthesia note documentation.

## 2022-07-19 NOTE — Anesthesia Procedure Notes (Signed)
Procedure Name: Intubation Date/Time: 07/19/2022 12:37 PM  Performed by: Drema Pry, CRNAPre-anesthesia Checklist: Patient identified, Emergency Drugs available, Suction available and Patient being monitored Patient Re-evaluated:Patient Re-evaluated prior to induction Oxygen Delivery Method: Circle System Utilized Preoxygenation: Pre-oxygenation with 100% oxygen Induction Type: IV induction Ventilation: Mask ventilation without difficulty Laryngoscope Size: Robertshaw, Glidescope and 3 Tube type: Oral Tube size: 7.0 mm Number of attempts: 2 Airway Equipment and Method: Stylet and Oral airway Placement Confirmation: ETT inserted through vocal cords under direct vision, positive ETCO2 and breath sounds checked- equal and bilateral Secured at: 20 cm Tube secured with: Tape Dental Injury: Teeth and Oropharynx as per pre-operative assessment  Difficulty Due To: Difficulty was anticipated, Difficult Airway- due to anterior larynx and Difficult Airway- due to limited oral opening Comments: DL x1 with MAC 3 - grade III view, unable to pass ETT; VLx1 with glidego 3 - grade II view, able to advance ETT past cords

## 2022-07-19 NOTE — Anesthesia Postprocedure Evaluation (Signed)
Anesthesia Post Note  Patient: Amy Stout  Procedure(s) Performed: ENDOSCOPIC RETROGRADE CHOLANGIOPANCREATOGRAPHY (ERCP) SPHINCTEROTOMY REMOVAL OF STONES     Patient location during evaluation: PACU Anesthesia Type: General Level of consciousness: awake and alert Pain management: pain level controlled Vital Signs Assessment: post-procedure vital signs reviewed and stable Respiratory status: spontaneous breathing, nonlabored ventilation and respiratory function stable Cardiovascular status: blood pressure returned to baseline and stable Postop Assessment: no apparent nausea or vomiting Anesthetic complications: yes  Encounter Notable Events  Notable Event Outcome Phase Comment  Difficult to intubate - expected  Intraprocedure Filed from anesthesia note documentation.    Last Vitals:  Vitals:   07/19/22 1340 07/19/22 1350  BP: 128/78 129/79  Pulse: (!) 57 (!) 56  Resp: 13 13  Temp:    SpO2: 97% 95%    Last Pain:  Vitals:   07/19/22 1340  TempSrc:   PainSc: 0-No pain                 Callia Swim,W. EDMOND

## 2022-07-20 DIAGNOSIS — R7989 Other specified abnormal findings of blood chemistry: Secondary | ICD-10-CM

## 2022-07-20 DIAGNOSIS — K8000 Calculus of gallbladder with acute cholecystitis without obstruction: Secondary | ICD-10-CM

## 2022-07-20 DIAGNOSIS — K838 Other specified diseases of biliary tract: Secondary | ICD-10-CM | POA: Diagnosis not present

## 2022-07-20 LAB — CBC
HCT: 34.7 % — ABNORMAL LOW (ref 36.0–46.0)
Hemoglobin: 11.8 g/dL — ABNORMAL LOW (ref 12.0–15.0)
MCH: 30 pg (ref 26.0–34.0)
MCHC: 34 g/dL (ref 30.0–36.0)
MCV: 88.3 fL (ref 80.0–100.0)
Platelets: 162 10*3/uL (ref 150–400)
RBC: 3.93 MIL/uL (ref 3.87–5.11)
RDW: 12.8 % (ref 11.5–15.5)
WBC: 6.2 10*3/uL (ref 4.0–10.5)
nRBC: 0 % (ref 0.0–0.2)

## 2022-07-20 LAB — COMPREHENSIVE METABOLIC PANEL
ALT: 269 U/L — ABNORMAL HIGH (ref 0–44)
AST: 95 U/L — ABNORMAL HIGH (ref 15–41)
Albumin: 2.7 g/dL — ABNORMAL LOW (ref 3.5–5.0)
Alkaline Phosphatase: 246 U/L — ABNORMAL HIGH (ref 38–126)
Anion gap: 10 (ref 5–15)
BUN: 5 mg/dL — ABNORMAL LOW (ref 6–20)
CO2: 28 mmol/L (ref 22–32)
Calcium: 8.8 mg/dL — ABNORMAL LOW (ref 8.9–10.3)
Chloride: 102 mmol/L (ref 98–111)
Creatinine, Ser: 0.8 mg/dL (ref 0.44–1.00)
GFR, Estimated: >60 mL/min (ref >60)
Glucose, Bld: 116 mg/dL — ABNORMAL HIGH (ref 70–99)
Potassium: 3.6 mmol/L (ref 3.5–5.1)
Sodium: 140 mmol/L (ref 135–145)
Total Bilirubin: 1.3 mg/dL — ABNORMAL HIGH (ref 0.3–1.2)
Total Protein: 5.2 g/dL — ABNORMAL LOW (ref 6.5–8.1)

## 2022-07-20 LAB — SURGICAL PATHOLOGY

## 2022-07-20 MED ORDER — AMOXICILLIN-POT CLAVULANATE 875-125 MG PO TABS: 1.0000 | ORAL_TABLET | Freq: Two times a day (BID) | ORAL | 0 refills | Status: DC

## 2022-07-20 MED ORDER — METHOCARBAMOL 750 MG PO TABS: 750.0000 mg | ORAL_TABLET | Freq: Three times a day (TID) | ORAL | 0 refills | Status: DC

## 2022-07-20 MED ORDER — DOCUSATE SODIUM 100 MG PO CAPS: 100.0000 mg | ORAL_CAPSULE | Freq: Two times a day (BID) | ORAL | Status: DC | PRN

## 2022-07-20 MED ORDER — OXYCODONE HCL 5 MG PO TABS: 5.0000 mg | ORAL_TABLET | Freq: Four times a day (QID) | ORAL | 0 refills | Status: DC | PRN

## 2022-07-20 MED ORDER — ACETAMINOPHEN 500 MG PO TABS: 1000.0000 mg | ORAL_TABLET | Freq: Three times a day (TID) | ORAL | Status: DC | PRN

## 2022-07-20 MED ORDER — POLYETHYLENE GLYCOL 3350 17 G PO PACK: 17.0000 g | PACK | Freq: Every day | ORAL | Status: DC | PRN

## 2022-07-20 NOTE — Progress Notes (Signed)
1 Day Post-Op  Subjective: CC: S/p ERCP. Feeling better after. Sore around her incisions with movement. No pain at rest. Pain well controlled with PO medications. Tolerating cld without n/v. Passing flatus. No bm. Voiding. Mobilizing in the room. Wants to go home.   Afebrile. No tachycardia or hypotension. WBC wnl. LFT's downtrending.   Objective: Vital signs in last 24 hours: Temp:  [97.5 F (36.4 C)-98.6 F (37 C)] 98 F (36.7 C) (07/15 0720) Pulse Rate:  [56-69] 56 (07/15 0720) Resp:  [13-18] 17 (07/15 0351) BP: (113-137)/(70-92) 132/92 (07/15 0720) SpO2:  [92 %-99 %] 98 % (07/15 0720) Last BM Date : 07/14/22  Intake/Output from previous day: 07/14 0701 - 07/15 0700 In: 900 [I.V.:900] Out: 5 [Blood:5] Intake/Output this shift: Total I/O In: 240 [P.O.:240] Out: -   PE:  Gen:  Alert, NAD, pleasant Card:  Reg Pulm:  CTAB, no W/R/R, effort normal Abd: Soft, no distension, appropriately tender around laparoscopic incisions, no rigidity or guarding and otherwise NT, +BS. Incisions with glue intact appears well and are without drainage, bleeding, or signs of infection. JP drain SS.  Psych: A&Ox3   Lab Results:  Recent Labs    07/19/22 0150 07/20/22 0256  WBC 5.3 6.2  HGB 12.0 11.8*  HCT 37.5 34.7*  PLT 151 162   BMET Recent Labs    07/19/22 0150 07/20/22 0256  NA 139 140  K 3.3* 3.6  CL 104 102  CO2 28 28  GLUCOSE 109* 116*  BUN 5* 5*  CREATININE 0.79 0.80  CALCIUM 8.6* 8.8*   PT/INR No results for input(s): "LABPROT", "INR" in the last 72 hours. CMP     Component Value Date/Time   NA 140 07/20/2022 0256   NA 142 10/30/2021 1250   K 3.6 07/20/2022 0256   CL 102 07/20/2022 0256   CO2 28 07/20/2022 0256   GLUCOSE 116 (H) 07/20/2022 0256   BUN 5 (L) 07/20/2022 0256   BUN 11 10/30/2021 1250   CREATININE 0.80 07/20/2022 0256   CALCIUM 8.8 (L) 07/20/2022 0256   PROT 5.2 (L) 07/20/2022 0256   PROT 7.3 10/30/2021 1250   ALBUMIN 2.7 (L) 07/20/2022  0256   ALBUMIN 4.5 10/30/2021 1250   AST 95 (H) 07/20/2022 0256   ALT 269 (H) 07/20/2022 0256   ALKPHOS 246 (H) 07/20/2022 0256   BILITOT 1.3 (H) 07/20/2022 0256   BILITOT 0.3 10/30/2021 1250   GFRNONAA >60 07/20/2022 0256   GFRAA 100 11/21/2019 1621   Lipase     Component Value Date/Time   LIPASE 31 07/19/2022 0150    Studies/Results: DG ERCP  Result Date: 07/19/2022 CLINICAL DATA:  ERCP. EXAM: ERCP TECHNIQUE: Multiple spot images obtained with the fluoroscopic device and submitted for interpretation post-procedure. COMPARISON:  MRCP-07/18/2022 FLUOROSCOPY TIME: FLUOROSCOPY TIME 1 minute, 32 seconds (11.9 mGy) FINDINGS: Fourteen spot intraoperative fluoroscopic images of the right upper abdominal quadrant during ERCP are provided for review Initial image demonstrates an ERCP probe overlying the right upper abdominal quadrant. Cholecystectomy clips overlie the expected location of the gallbladder fossa. A large bore surgical drainage catheter overlies the right upper abdominal quadrant Subsequent images demonstrate selective cannulation and opacification of the common bile duct Subsequent images demonstrate insufflation of a balloon within the central aspect of the CBD with subsequent biliary sweeping presumed stone extraction and sphincterotomy. There is apparent slight opacification of the cystic duct remnant without definitive evidence of a bile leak. There is minimal opacification of the intrahepatic biliary tree  which appears nondilated. No definitive opacification of the pancreatic duct. IMPRESSION: 1. ERCP with biliary sweeping and presumed stone extraction and sphincterotomy as above. 2. No definitive evidence of bile leak from the cystic duct remnant. Correlation with the operative report is advised. If clinical concern persists for a bile leak, further evaluation with a nuclear medicine HIDA scan could be performed as indicated. These images were submitted for radiologic interpretation  only. Please see the procedural report for the amount of contrast and the fluoroscopy time utilized. Electronically Signed   By: Simonne Come M.D.   On: 07/19/2022 18:02   MR ABDOMEN MRCP W WO CONTAST  Result Date: 07/18/2022 CLINICAL DATA:  Jaundice. Postop from laparoscopic cholecystectomy yesterday. EXAM: MRI ABDOMEN WITHOUT AND WITH CONTRAST (INCLUDING MRCP) TECHNIQUE: Multiplanar multisequence MR imaging of the abdomen was performed both before and after the administration of intravenous contrast. Heavily T2-weighted images of the biliary and pancreatic ducts were obtained, and three-dimensional MRCP images were rendered by post processing. CONTRAST:  7.57mL GADAVIST GADOBUTROL 1 MMOL/ML IV SOLN COMPARISON:  None Available. FINDINGS: Lower chest: Bilateral lower lobe atelectasis or infiltrates and tiny bilateral pleural effusions. Hepatobiliary: No hepatic masses identified. Postop changes are seen from recent cholecystectomy. Small fluid collection is seen in the gallbladder fossa measuring 3.6 x 2.0 cm. Adjacent right upper quadrant inflammatory changes are also seen. No evidence of biliary ductal dilatation with common bile duct measuring 5 mm. A 2-3 mm filling defect is seen in the distal common bile duct, consistent with a tiny calculus. Pancreas: No mass or inflammatory changes. No evidence of pancreatic ductal dilatation or pancreas divisum. Spleen:  Within normal limits in size and appearance. Adrenals/Urinary Tract: No suspicious masses identified. No evidence of hydronephrosis. Stomach/Bowel: Unremarkable. Vascular/Lymphatic: No pathologically enlarged lymph nodes identified. No acute vascular findings. Other:  Mild perihepatic and splenic ascites. Musculoskeletal:  No suspicious bone lesions identified. IMPRESSION: Recent postop changes from cholecystectomy. Small fluid collection in the gallbladder fossa, which may represent a postoperative seroma, although biloma due to bile leak cannot be  excluded. Consider nuclear medicine HIDA scan if clinically warranted. 2-3 mm calculus in distal common bile duct. No evidence of biliary ductal dilatation. Mild bilateral upper quadrant ascites. Bilateral lower lobe atelectasis or infiltrates, and tiny bilateral pleural effusions. Electronically Signed   By: Danae Orleans M.D.   On: 07/18/2022 12:09    Anti-infectives: Anti-infectives (From admission, onward)    Start     Dose/Rate Route Frequency Ordered Stop   07/19/22 1100  ampicillin-sulbactam (UNASYN) 1.5 g in sodium chloride 0.9 % 100 mL IVPB  Status:  Discontinued        1.5 g 200 mL/hr over 30 Minutes Intravenous  Once 07/19/22 1056 07/19/22 1408   07/16/22 0815  cefTRIAXone (ROCEPHIN) 2 g in sodium chloride 0.9 % 100 mL IVPB        2 g 200 mL/hr over 30 Minutes Intravenous Every 24 hours 07/16/22 0804 07/23/22 0814        Assessment/Plan POD 3 s/p Laparoscopic Cholecystectomy for Acute Cholecystitis by Dr. Luisa Hart on 07/17/22 - S/p ERCP 7/14 with removal of choledocholithiasis. Biliary sphincterotomy performed. LFT's downtrending this am.  - Cont JP drain, currently SS. Will discuss MD timing of f/u for removal.  - Adv diet. If tolerates diet advancement anticipate d/c today. Discussed discharge instructions, restrictions and return/call back precautions.   FEN - Reg diet VTE - SCDs, Lovenox  ID - Rocephin written for 7d   LOS: 2 days  Jacinto Halim , [facility] 07/20/2022, 10:41 AM Please see Amion for pager number during day hours 7:00am-4:30pm

## 2022-07-20 NOTE — Discharge Instructions (Signed)
LAPAROSCOPIC SURGERY: POST OP INSTRUCTIONS Always review your discharge instruction sheet given to you by the facility where your surgery was performed. IF YOU HAVE DISABILITY OR FAMILY LEAVE FORMS, YOU MUST BRING THEM TO THE OFFICE FOR PROCESSING.   DO NOT GIVE THEM TO YOUR DOCTOR.  PAIN CONTROL  First take acetaminophen (Tylenol) AND/or ibuprofen (Advil) to control your pain after surgery.  Follow directions on package.  Taking acetaminophen (Tylenol) and/or ibuprofen (Advil) regularly after surgery will help to control your pain and lower the amount of prescription pain medication you may need.  You should not take more than 3,000 mg (3 grams) of acetaminophen (Tylenol) in 24 hours.  You should not take ibuprofen (Advil), aleve, motrin, naprosyn or other NSAIDS if you have a history of stomach ulcers or chronic kidney disease.  A prescription for pain medication may be given to you upon discharge.  Take your pain medication as prescribed, if you still have uncontrolled pain after taking acetaminophen (Tylenol) or ibuprofen (Advil). Use ice packs to help control pain. If you need a refill on your pain medication, please contact your pharmacy.  They will contact our office to request authorization. Prescriptions will not be filled after 5pm or on week-ends.  HOME MEDICATIONS Take your usually prescribed medications unless otherwise directed.  DIET You should follow a light diet the first few days after arrival home.  Be sure to include lots of fluids daily. Avoid fatty, fried foods.   CONSTIPATION It is common to experience some constipation after surgery and if you are taking pain medication.  Increasing fluid intake and taking a stool softener (such as Colace) will usually help or prevent this problem from occurring.  A mild laxative (Milk of Magnesia or Miralax) should be taken according to package instructions if there are no bowel movements after 48 hours.  WOUND/INCISION CARE Most  patients will experience some swelling and bruising in the area of the incisions.  Ice packs will help.  Swelling and bruising can take several days to resolve.  Unless discharge instructions indicate otherwise, follow guidelines below  STERI-STRIPS - you may remove your outer bandages 48 hours after surgery, and you may shower at that time.  You have steri-strips (small skin tapes) in place directly over the incision.  These strips should be left on the skin for 7-10 days.   DERMABOND/SKIN GLUE - you may shower in 24 hours.  The glue will flake off over the next 2-3 weeks. Any sutures or staples will be removed at the office during your follow-up visit.  ACTIVITIES You may resume regular (light) daily activities beginning the next day--such as daily self-care, walking, climbing stairs--gradually increasing activities as tolerated.  You may have sexual intercourse when it is comfortable.  Refrain from any heavy lifting or straining until approved by your doctor. You may drive when you are no longer taking prescription pain medication, you can comfortably wear a seatbelt, and you can safely maneuver your car and apply brakes.  FOLLOW-UP You should see your doctor in the office for a follow-up appointment approximately 2-3 weeks after your surgery.  You should have been given your post-op/follow-up appointment when your surgery was scheduled.  If you did not receive a post-op/follow-up appointment, make sure that you call for this appointment within a day or two after you arrive home to insure a convenient appointment time.  OTHER INSTRUCTIONS   WHEN TO CALL YOUR DOCTOR: Fever over 101.0 Inability to urinate Continued bleeding from incision. Increased pain,  redness, or drainage from the incision. Increasing abdominal pain  The clinic staff is available to answer your questions during regular business hours.  Please don't hesitate to call and ask to speak to one of the nurses for clinical  concerns.  If you have a medical emergency, go to the nearest emergency room or call 911.  A surgeon from [facility] is always on call at the hospital. [address], [place], [place]  [address] ? [address] [address], [place], [place]   [address] [phone] ? [phone] ? FAX [phone] Web site: [email or web address]     Managing Your Pain After Surgery Without Opioids    Thank you for participating in our program to help patients manage their pain after surgery without opioids. This is part of our effort to provide you with the best care possible, without exposing you or your family to the risk that opioids pose.  What pain can I expect after surgery? You can expect to have some pain after surgery. This is normal. The pain is typically worse the day after surgery, and quickly begins to get better. Many studies have found that many patients are able to manage their pain after surgery with Over-the-Counter (OTC) medications such as Tylenol and Motrin. If you have a condition that does not allow you to take Tylenol or Motrin, notify your surgical team.  How will I manage my pain? The best strategy for controlling your pain after surgery is around the clock pain control with Tylenol (acetaminophen) and Motrin (ibuprofen or Advil). Alternating these medications with each other allows you to maximize your pain control. In addition to Tylenol and Motrin, you can use heating pads or ice packs on your incisions to help reduce your pain.  How will I alternate your regular strength over-the-counter pain medication? You will take a dose of pain medication every three hours. Start by taking 650 mg of Tylenol (2 pills of 325 mg) 3 hours later take 600 mg of Motrin (3 pills of 200 mg) 3 hours after taking the Motrin take 650 mg of Tylenol 3 hours after that take 600 mg of Motrin.   - 1 -  See example - if your first dose of Tylenol is at 12:00 PM   12:00  PM Tylenol 650 mg (2 pills of 325 mg)  3:00 PM Motrin 600 mg (3 pills of 200 mg)  6:00 PM Tylenol 650 mg (2 pills of 325 mg)  9:00 PM Motrin 600 mg (3 pills of 200 mg)  Continue alternating every 3 hours   We recommend that you follow this schedule around-the-clock for at least 3 days after surgery, or until you feel that it is no longer needed. Use the table on the last page of this handout to keep track of the medications you are taking. Important: Do not take more than 3000mg  of Tylenol or 1600mg  of Motrin in a 24-hour period. Do not take ibuprofen/Motrin if you have a history of bleeding stomach ulcers, severe kidney disease, &/or actively taking a blood thinner  What if I still have pain? If you have pain that is not controlled with the over-the-counter pain medications (Tylenol and Motrin or Advil) you might have what we call "breakthrough" pain. You will receive a prescription for a small amount of an opioid pain medication such as Oxycodone, Tramadol, or Tylenol with Codeine. Use these opioid pills in the first 24 hours after surgery if you have breakthrough pain. Do not take more  than 1 pill every 4-6 hours.  If you still have uncontrolled pain after using all opioid pills, don't hesitate to call our staff using the number provided. We will help make sure you are managing your pain in the best way possible, and if necessary, we can provide a prescription for additional pain medication.   Day 1    Time  Name of Medication Number of pills taken  Amount of Acetaminophen  Pain Level   Comments  AM PM       AM PM       AM PM       AM PM       AM PM       AM PM       AM PM       AM PM       Total Daily amount of Acetaminophen Do not take more than  3,000 mg per day      Day 2    Time  Name of Medication Number of pills taken  Amount of Acetaminophen  Pain Level   Comments  AM PM       AM PM       AM PM       AM PM       AM PM       AM PM       AM PM       AM PM        Total Daily amount of Acetaminophen Do not take more than  3,000 mg per day      Day 3    Time  Name of Medication Number of pills taken  Amount of Acetaminophen  Pain Level   Comments  AM PM       AM PM       AM PM       AM PM         AM PM       AM PM       AM PM       AM PM       Total Daily amount of Acetaminophen Do not take more than  3,000 mg per day      Day 4    Time  Name of Medication Number of pills taken  Amount of Acetaminophen  Pain Level   Comments  AM PM       AM PM       AM PM       AM PM       AM PM       AM PM       AM PM       AM PM       Total Daily amount of Acetaminophen Do not take more than  3,000 mg per day      Day 5    Time  Name of Medication Number of pills taken  Amount of Acetaminophen  Pain Level   Comments  AM PM       AM PM       AM PM       AM PM       AM PM       AM PM       AM PM       AM PM       Total Daily amount of Acetaminophen Do not take more than  3,000 mg per day  Day 6    Time  Name of Medication Number of pills taken  Amount of Acetaminophen  Pain Level  Comments  AM PM       AM PM       AM PM       AM PM       AM PM       AM PM       AM PM       AM PM       Total Daily amount of Acetaminophen Do not take more than  3,000 mg per day      Day 7    Time  Name of Medication Number of pills taken  Amount of Acetaminophen  Pain Level   Comments  AM PM       AM PM       AM PM       AM PM       AM PM       AM PM       AM PM       AM PM       Total Daily amount of Acetaminophen Do not take more than  3,000 mg per day        For additional information about how and where to safely dispose of unused opioid medications - [employer]  Disclaimer: This document contains information and/or instructional materials adapted from [facility] for the typical patient with your condition. It does not replace medical advice from your health  care provider because your experience may differ from that of the typical patient. Talk to your health care provider if you have any questions about this document, your condition or your treatment plan. Adapted from [facility]

## 2022-07-20 NOTE — Plan of Care (Signed)
Patient alert/oriented X4. Patient compliant with medication administration and oxycodone given as needed for pain. Demonstrated to patient how to empty the JP drain appropriately and went over AVS discharge instructions in detail. PIV removed prior to discharge. VSS,.  Problem: Education: Goal: Knowledge of General Education information will improve Description: Including pain rating scale, medication(s)/side effects and non-pharmacologic comfort measures 07/20/2022 1439 by Rodman Pickle, RN Outcome: Adequate for Discharge 07/20/2022 1211 by Rodman Pickle, RN Outcome: Progressing   Problem: Health Behavior/Discharge Planning: Goal: Ability to manage health-related needs will improve 07/20/2022 1439 by Rodman Pickle, RN Outcome: Adequate for Discharge 07/20/2022 1211 by Rodman Pickle, RN Outcome: Progressing   Problem: Clinical Measurements: Goal: Ability to maintain clinical measurements within normal limits will improve 07/20/2022 1439 by Rodman Pickle, RN Outcome: Adequate for Discharge 07/20/2022 1211 by Rodman Pickle, RN Outcome: Progressing   Problem: Clinical Measurements: Goal: Will remain free from infection 07/20/2022 1439 by Rodman Pickle, RN Outcome: Adequate for Discharge 07/20/2022 1211 by Rodman Pickle, RN Outcome: Progressing   Problem: Clinical Measurements: Goal: Diagnostic test results will improve 07/20/2022 1439 by Rodman Pickle, RN Outcome: Adequate for Discharge 07/20/2022 1211 by Rodman Pickle, RN Outcome: Progressing   Problem: Clinical Measurements: Goal: Respiratory complications will improve 07/20/2022 1439 by Rodman Pickle, RN Outcome: Adequate for Discharge 07/20/2022 1211 by Rodman Pickle, RN Outcome: Progressing   Problem: Clinical Measurements: Goal: Cardiovascular complication will be avoided 07/20/2022 1439 by Rodman Pickle, RN Outcome: Adequate for Discharge 07/20/2022 1211 by Rodman Pickle, RN Outcome: Progressing   Problem:  Activity: Goal: Risk for activity intolerance will decrease 07/20/2022 1439 by Rodman Pickle, RN Outcome: Adequate for Discharge 07/20/2022 1211 by Rodman Pickle, RN Outcome: Progressing   Problem: Nutrition: Goal: Adequate nutrition will be maintained 07/20/2022 1439 by Rodman Pickle, RN Outcome: Adequate for Discharge 07/20/2022 1211 by Rodman Pickle, RN Outcome: Progressing   Problem: Coping: Goal: Level of anxiety will decrease 07/20/2022 1439 by Rodman Pickle, RN Outcome: Adequate for Discharge 07/20/2022 1211 by Rodman Pickle, RN Outcome: Progressing   Problem: Elimination: Goal: Will not experience complications related to bowel motility 07/20/2022 1439 by Rodman Pickle, RN Outcome: Adequate for Discharge 07/20/2022 1211 by Rodman Pickle, RN Outcome: Progressing   Problem: Elimination: Goal: Will not experience complications related to urinary retention 07/20/2022 1439 by Rodman Pickle, RN Outcome: Adequate for Discharge 07/20/2022 1211 by Rodman Pickle, RN Outcome: Progressing   Problem: Pain Managment: Goal: General experience of comfort will improve 07/20/2022 1439 by Rodman Pickle, RN Outcome: Adequate for Discharge 07/20/2022 1211 by Rodman Pickle, RN Outcome: Progressing   Problem: Safety: Goal: Ability to remain free from injury will improve 07/20/2022 1439 by Rodman Pickle, RN Outcome: Adequate for Discharge 07/20/2022 1211 by Rodman Pickle, RN Outcome: Progressing   Problem: Skin Integrity: Goal: Risk for impaired skin integrity will decrease 07/20/2022 1439 by Rodman Pickle, RN Outcome: Adequate for Discharge 07/20/2022 1211 by Rodman Pickle, RN Outcome: Progressing

## 2022-07-20 NOTE — TOC CM/SW Note (Signed)
Transition of Care [facility]) - Inpatient Brief Assessment   Patient Details  Name: Amy Stout MRN: [id number] Date of Birth: 1965-01-18  [facility]) CM/SW Contact:    Harriet Masson, RN Phone Number: 07/20/2022, 10:31 AM   Clinical Narrative:  Patient had lap chole 7/12 and ERCP 7/14.  TOC following.   Transition of Care Asessment: Insurance and Status: Insurance coverage has been reviewed Patient has primary care physician: Yes Home environment has been reviewed: safe to discharge home Prior level of function:: independent Prior/Current Home Services: No current home services Social Determinants of Health Reivew: SDOH reviewed no interventions necessary Readmission risk has been reviewed: Yes Transition of care needs: no transition of care needs at this time

## 2022-07-20 NOTE — TOC Transition Note (Signed)
Transition of Care [facility]) - CM/SW Discharge Note   Patient Details  Name: Amy Stout MRN: [id number] Date of Birth: 1965-09-26  Transition of Care [facility]) CM/SW Contact:  Harriet Masson, RN Phone Number: 07/20/2022, 2:21 PM   Clinical Narrative:    Patient stable for discharge.  Follow up apts on AVS.  No other TOC needs.    Final next level of care: Home/Self Care Barriers to Discharge: Barriers Resolved   Patient Goals and CMS Choice    Return home  Discharge Placement    home                     Discharge Plan and Services Additional resources added to the After Visit Summary for                                       Social Determinants of Health (SDOH) Interventions SDOH Screenings   Food Insecurity: No Food Insecurity (07/16/2022)  Housing: Low Risk  (07/16/2022)  Transportation Needs: No Transportation Needs (07/16/2022)  Utilities: Not At Risk (07/16/2022)  Depression (PHQ2-9): Low Risk  (03/30/2022)  Social Connections: Unknown (05/19/2021)   Received from [facility]  Stress: No Stress Concern Present (11/08/2018)   Received from [facility], [facility]  Tobacco Use: Low Risk  (07/19/2022)  Health Literacy: Low Risk  (04/15/2020)   Received from [facility], [facility]     Readmission Risk Interventions    07/20/2022    2:21 PM  Readmission Risk Prevention Plan  Post Dischage Appt Complete  Medication Screening Complete  Transportation Screening Complete

## 2022-07-20 NOTE — Progress Notes (Addendum)
Daily Progress Note  DOA: 07/16/2022 Hospital Day: 5 Chief Complaint: CBD stone   Attending physician's note   I have taken a history, reviewed the chart and examined the patient. I performed a substantive portion of this encounter, including complete performance of at least one of the key components, in conjunction with the APP. I agree with the APP's note, impression and recommendations.   S/p ERCP with sphincterotomy and balloon extraction of biliary stone and sludge. Repeat LFT in 1 week to make sure LFT are back to baseline/normal range Follow-up with surgery for postop care  The patient was provided an opportunity to ask questions and all were answered. The patient agreed with the plan and demonstrated an understanding of the instructions.   [name] , MD [phone]    Assessment and Plan:    Brief Narrative:  Amy Stout is a 57 y.o. year old female with a past medical history not limited to  GERD, migraine, colon polyps ( followed in [facility])   Cholelithiasis / choledocholithiasis s/p ERCP with sphincterotomy and balloon extraction of a stone and sludge  -liver tests improving. Tbili 2.3 >> 1.3. Feels okay today. She is probably going home if tolerates diet.    Subjective / New Events:  No abdominal pain just post-op soreness   Objective:   07/19/22 ERCP - Choledocholithiasis s/p biliary sphincterotomy and balloon extraction. - S/P cholecystectomy.  Recent Labs    07/18/22 1149 07/19/22 0150 07/20/22 0256  WBC 7.6 5.3 6.2  HGB 12.4 12.0 11.8*  HCT 38.3 37.5 34.7*  PLT 136* 151 162   BMET Recent Labs    07/18/22 1149 07/19/22 0150 07/20/22 0256  NA 137 139 140  K 3.4* 3.3* 3.6  CL 104 104 102  CO2 22 28 28   GLUCOSE 107* 109* 116*  BUN 7 5* 5*  CREATININE 0.83 0.79 0.80  CALCIUM 8.7* 8.6* 8.8*   LFT Recent Labs    07/20/22 0256  PROT 5.2*  ALBUMIN 2.7*  AST 95*  ALT 269*  ALKPHOS 246*  BILITOT 1.3*   PT/INR No  results for input(s): "LABPROT", "INR" in the last 72 hours.   Imaging:  DG ERCP CLINICAL DATA:  ERCP.  EXAM: ERCP  TECHNIQUE: Multiple spot images obtained with the fluoroscopic device and submitted for interpretation post-procedure.  COMPARISON:  MRCP-07/18/2022  FLUOROSCOPY TIME: FLUOROSCOPY TIME 1 minute, 32 seconds (11.9 mGy)  FINDINGS: Fourteen spot intraoperative fluoroscopic images of the right upper abdominal quadrant during ERCP are provided for review  Initial image demonstrates an ERCP probe overlying the right upper abdominal quadrant. Cholecystectomy clips overlie the expected location of the gallbladder fossa. A large bore surgical drainage catheter overlies the right upper abdominal quadrant  Subsequent images demonstrate selective cannulation and opacification of the common bile duct  Subsequent images demonstrate insufflation of a balloon within the central aspect of the CBD with subsequent biliary sweeping presumed stone extraction and sphincterotomy.  There is apparent slight opacification of the cystic duct remnant without definitive evidence of a bile leak.  There is minimal opacification of the intrahepatic biliary tree which appears nondilated. No definitive opacification of the pancreatic duct.  IMPRESSION: 1. ERCP with biliary sweeping and presumed stone extraction and sphincterotomy as above. 2. No definitive evidence of bile leak from the cystic duct remnant. Correlation with the operative report is advised. If clinical concern persists for a bile leak, further evaluation with a nuclear medicine HIDA scan could be performed as  indicated.  These images were submitted for radiologic interpretation only. Please see the procedural report for the amount of contrast and the fluoroscopy time utilized.  Electronically Signed   By: Simonne Come M.D.   On: 07/19/2022 18:02     Scheduled inpatient medications:   acetaminophen  1,000 mg  Oral Q8H   docusate sodium  100 mg Oral BID   enoxaparin (LOVENOX) injection  40 mg Subcutaneous Q24H   indomethacin  100 mg Rectal Once   methocarbamol  750 mg Oral TID   pantoprazole  40 mg Oral Daily   polyethylene glycol  17 g Oral Daily   Continuous inpatient infusions:   sodium chloride 75 mL/hr at 07/19/22 0923   cefTRIAXone (ROCEPHIN)  IV 2 g (07/20/22 0916)   PRN inpatient medications: diphenhydrAMINE **OR** diphenhydrAMINE, hydrALAZINE, metoprolol tartrate, morphine injection, ondansetron **OR** ondansetron (ZOFRAN) IV, oxyCODONE, simethicone  Vital signs in last 24 hours: Temp:  [97.8 F (36.6 C)-98.6 F (37 C)] 98 F (36.7 C) (07/15 0720) Pulse Rate:  [56-61] 56 (07/15 0720) Resp:  [13-17] 17 (07/15 0351) BP: (113-137)/(70-92) 132/92 (07/15 0720) SpO2:  [92 %-99 %] 98 % (07/15 0720) Last BM Date : 07/14/22  Intake/Output Summary (Last 24 hours) at 07/20/2022 1125 Last data filed at 07/20/2022 4098 Gross per 24 hour  Intake 1140 ml  Output 5 ml  Net 1135 ml    Intake/Output from previous day: 07/14 0701 - 07/15 0700 In: 900 [I.V.:900] Out: 5 [Blood:5] Intake/Output this shift: Total I/O In: 240 [P.O.:240] Out: -    Physical Exam:  General: Alert female in NAD Heart:  Regular rate and rhythm.  Pulmonary: Normal respiratory effort Abdomen: Soft, nondistended. Normal bowel sounds.  Neurologic: Alert and oriented Psych: Pleasant. Cooperative. Insight appears normal.    Principal Problem:   Cholecystitis with cholelithiasis Active Problems:   Elevated liver enzymes   Biliary colic     LOS: 2 days   [name] ,NP 07/20/2022, 11:25 AM

## 2022-07-21 ENCOUNTER — Encounter (HOSPITAL_COMMUNITY): Payer: Self-pay | Admitting: Gastroenterology

## 2022-07-22 ENCOUNTER — Telehealth: Payer: Self-pay

## 2022-07-22 NOTE — Transitions of Care (Post Inpatient/ED Visit) (Signed)
07/22/2022  Name: Amy Stout MRN: [id number] DOB: 1965-06-15  Today's TOC FU Call Status: Today's TOC FU Call Status:: Successful TOC FU Call Competed TOC FU Call Complete Date: 07/22/22  Transition Care Management Follow-up Telephone Call Date of Discharge: 07/20/22 Discharge Facility: [facility]) Type of Discharge: Inpatient Admission Primary Inpatient Discharge Diagnosis:: Acute Cholecystitis How have you been since you were released from the hospital?: Worse (Patient having issues with nausea) Any questions or concerns?: Yes Patient Questions/Concerns:: Patient has been very nauseated since this morning when 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 Patient Questions/Concerns Addressed: Psychologist, educational of Patient Questions/Concerns (Contacted [facility] and spoke with nurse Toniann Fail as patient was looking for anti-nausea medicine)  Items Reviewed: Did you receive and understand the discharge instructions provided?: Yes Medications obtained,verified, and reconciled?: Yes (Medications Reviewed) Any new allergies since your discharge?: No Dietary orders reviewed?: No Do you have support at home?: Yes People in Home: spouse Name of Support/Comfort Primary Source: Tommy  Medications Reviewed Today: Medications Reviewed Today     Reviewed by Jodelle Gross, RN (Case Manager) on 07/22/22 at 1301  Med List Status: <None>   Medication Order Taking? Sig Documenting Provider Last Dose Status Informant  acetaminophen (TYLENOL) 500 MG tablet [id number]  Take 2 tablets (1,000 mg total) by mouth every 8 (eight) hours as needed. Jacinto Halim, PA-C  Active   amoxicillin-clavulanate (AUGMENTIN) 875-125 MG tablet [id number] Yes Take 1 tablet by mouth every 12 (twelve) hours. Maczis, Elmer Sow, PA-C Taking Active   docusate sodium (COLACE) 100 MG capsule [id number] Yes Take 1 capsule (100 mg total) by mouth 2 (two) times daily as needed for mild constipation. Maczis,  Elmer Sow, PA-C Taking Active   esomeprazole (NEXIUM) 40 MG capsule [id number] Yes Take 1 capsule (40 mg total) by mouth daily at 12 noon. Junie Spencer, FNP Taking Active Self, Pharmacy Records  methocarbamol (ROBAXIN) 750 MG tablet [id number] Yes Take 1 tablet (750 mg total) by mouth 3 (three) times daily. Maczis, Elmer Sow, PA-C Taking Active   naproxen sodium (ALEVE) 220 MG tablet [id number]  Take 220 mg by mouth 2 (two) times daily as needed (Pain). [provider]  Active Self, Pharmacy Records  oxyCODONE (OXY IR/ROXICODONE) 5 MG immediate release tablet [id number] Yes Take 1 tablet (5 mg total) by mouth every 6 (six) hours as needed for breakthrough pain. Maczis, Elmer Sow, PA-C Taking Active   polyethylene glycol (MIRALAX / GLYCOLAX) 17 g packet [id number] Yes Take 17 g by mouth daily as needed. Maczis, Elmer Sow, PA-C Taking Active             Home Care and Equipment/Supplies: Were Home Health Services Ordered?: No Any new equipment or medical supplies ordered?: No  Functional Questionnaire: Do you need assistance with bathing/showering or dressing?: No Do you need assistance with meal preparation?: No Do you need assistance with eating?: No Do you have difficulty maintaining continence: No Do you need assistance with getting out of bed/getting out of a chair/moving?: No Do you have difficulty managing or taking your medications?: No  Follow up appointments reviewed: PCP Follow-up appointment confirmed?: NA Specialist Hospital Follow-up appointment confirmed?: Yes Date of Specialist follow-up appointment?: 07/24/22 Follow-Up Specialty Provider:: [facility] Do you need transportation to your follow-up appointment?: No Do you understand care options if your condition(s) worsen?: Yes-patient verbalized understanding  SDOH Interventions Today    Flowsheet Row Most Recent Value  SDOH Interventions  Food Insecurity Interventions Intervention Not Indicated   Housing Interventions Intervention Not Indicated  Transportation Interventions Intervention Not Indicated     Nurse Toniann Fail at [facility] to speak with MD and call patient back about her nausea.  Jodelle Gross, RN, BSN, CCM Care Management Coordinator [facility] Phone: [phone]/Fax: [phone]

## 2022-07-23 NOTE — Discharge Summary (Signed)
    Patient ID: Amy Stout [id number] 08-Apr-1965 57 y.o.  Admit date: 07/16/2022 Discharge date: 07/20/22  Discharge Diagnosis S/p Laparoscopic Cholecystectomy for Acute Cholecystitis by Dr. Luisa Hart on 07/17/22  Consultants GI  H&P: 58F with recurrent RUQ and R shoulder pain that usually goes away in a few hours, but began at 1700 on 7/10 without relief, causing her to present to the ED. Pain has resolved with pain medication in the ED. She denies n/v. She denies any prior abdominal surgery.   Procedures Dr. Luisa Hart - Laparoscopic Cholecystectomy - 07/17/22  Dr. Chales Abrahams - ERCP - 7/14  Hospital Course:  Patient presented as above. Was found to have Cholecystitis and elevated LFT's. Underwent Laparoscopic Cholecystectomy by Dr. Luisa Hart on 7/12. IOC was not able to be performed. MRCP post op positive for choledocholithiasis. ERCP performed by GI by GI on 7/14. Biliary sphincterotomy was performed. LFT's downtrending on day of discharge. On POD3, the patient was voiding well, tolerating diet, ambulating well, pain well controlled, vital signs stable, incisions c/d/I, drain non-bilous and felt stable for discharge home. Follow up as noted below. Discussed discharge instructions, restrictions and return/call back precautions.   Allergies as of 07/20/2022   No Known Allergies      Medication List     TAKE these medications    acetaminophen 500 MG tablet Commonly known as: TYLENOL Take 2 tablets (1,000 mg total) by mouth every 8 (eight) hours as needed.   amoxicillin-clavulanate 875-125 MG tablet Commonly known as: AUGMENTIN Take 1 tablet by mouth every 12 (twelve) hours.   docusate sodium 100 MG capsule Commonly known as: COLACE Take 1 capsule (100 mg total) by mouth 2 (two) times daily as needed for mild constipation.   esomeprazole 40 MG capsule Commonly known as: NEXIUM Take 1 capsule (40 mg total) by mouth daily at 12 noon.   methocarbamol 750 MG tablet Commonly known  as: ROBAXIN Take 1 tablet (750 mg total) by mouth 3 (three) times daily.   naproxen sodium 220 MG tablet Commonly known as: ALEVE Take 220 mg by mouth 2 (two) times daily as needed (Pain).   oxyCODONE 5 MG immediate release tablet Commonly known as: Oxy IR/ROXICODONE Take 1 tablet (5 mg total) by mouth every 6 (six) hours as needed for breakthrough pain.   polyethylene glycol 17 g packet Commonly known as: MIRALAX / GLYCOLAX Take 17 g by mouth daily as needed.          Follow-up Information     Hollye Pritt, Hedda Slade, [place]. Go on 08/06/2022.   Specialty: General Surgery Why: 345pm. Please arrive 30 minutes prior to your appointment for paperwork. Please bring a copy of your photo ID and insurance card. Contact information: [address] [place] [address] [phone]         [facility], [place] Follow up on 07/24/2022.   Specialty: General Surgery Why: 230pm. This is a nurse visit for drain check and possible removal. Please arrive 30 minutes prior to your appointment for paperwork. Please bring a copy of your photo ID and insurance card. Contact information: [address] [place] [address] [phone]                Signed: Leary Roca, [facility] 07/23/2022, 3:56 PM Please see Amion for pager number during day hours 7:00am-4:30pm

## 2022-08-19 ENCOUNTER — Other Ambulatory Visit: Payer: Self-pay | Admitting: Family

## 2022-08-19 DIAGNOSIS — K2100 Gastro-esophageal reflux disease with esophagitis, without bleeding: Secondary | ICD-10-CM

## 2022-08-19 NOTE — Telephone Encounter (Signed)
Apt scheduled for 9/6

## 2022-08-19 NOTE — Telephone Encounter (Signed)
[facility] NTBS in Sept for 6 mos FU RF sent to pharmacy

## 2022-09-01 DIAGNOSIS — Z1231 Encounter for screening mammogram for malignant neoplasm of breast: Secondary | ICD-10-CM | POA: Diagnosis not present

## 2022-09-01 DIAGNOSIS — R92333 Mammographic heterogeneous density, bilateral breasts: Secondary | ICD-10-CM | POA: Diagnosis not present

## 2022-09-01 LAB — HM MAMMOGRAPHY

## 2022-09-04 ENCOUNTER — Encounter: Payer: Self-pay | Admitting: Family

## 2022-09-11 ENCOUNTER — Ambulatory Visit: Payer: BC Managed Care – PPO | Admitting: Family

## 2022-10-19 ENCOUNTER — Ambulatory Visit: Payer: BC Managed Care – PPO | Admitting: Family

## 2022-10-19 ENCOUNTER — Encounter: Payer: Self-pay | Admitting: Family

## 2022-10-19 VITALS — BP 135/84 | HR 70 | Temp 97.8°F | Ht 66.0 in | Wt 151.0 lb

## 2022-10-19 DIAGNOSIS — Z0001 Encounter for general adult medical examination with abnormal findings: Secondary | ICD-10-CM

## 2022-10-19 DIAGNOSIS — K5900 Constipation, unspecified: Secondary | ICD-10-CM | POA: Insufficient documentation

## 2022-10-19 DIAGNOSIS — F411 Generalized anxiety disorder: Secondary | ICD-10-CM

## 2022-10-19 DIAGNOSIS — Z0000 Encounter for general adult medical examination without abnormal findings: Secondary | ICD-10-CM

## 2022-10-19 DIAGNOSIS — K2100 Gastro-esophageal reflux disease with esophagitis, without bleeding: Secondary | ICD-10-CM | POA: Diagnosis not present

## 2022-10-19 DIAGNOSIS — E669 Obesity, unspecified: Secondary | ICD-10-CM | POA: Diagnosis not present

## 2022-10-19 DIAGNOSIS — G43009 Migraine without aura, not intractable, without status migrainosus: Secondary | ICD-10-CM | POA: Diagnosis not present

## 2022-10-19 MED ORDER — ESOMEPRAZOLE MAGNESIUM 20 MG PO CPDR
20.0000 mg | DELAYED_RELEASE_CAPSULE | Freq: Every day | ORAL | 1 refills | Status: DC
Start: 2022-10-19 — End: 2023-04-19

## 2022-10-19 NOTE — Progress Notes (Signed)
Subjective:    Patient ID: Hilario Quarry, female    DOB: 1965/03/29, 57 y.o.   MRN: [id number]  Chief Complaint  Patient presents with   Medical Management of Chronic Issues   PT presents to the office today for CPE without pap.    Reports her GERD is improved since her cholecystectomy 07/24. Migraine  This is a chronic problem. The current episode started more than 1 year ago. The problem occurs seasonly. The problem has been gradually improving. The pain is located in the Right unilateral region. The pain quality is similar to prior headaches. She has tried Excedrin and triptans for the symptoms. The treatment provided moderate relief. Her past medical history is significant for migraine headaches.  Gastroesophageal Reflux She complains of belching and heartburn. This is a chronic problem. The current episode started more than 1 year ago. The problem occurs occasionally. She has tried a PPI for the symptoms. The treatment provided moderate relief.  Anxiety Presents for follow-up visit. Symptoms include excessive worry, nervous/anxious behavior and restlessness. Symptoms occur occasionally. The severity of symptoms is mild.    Constipation This is a chronic problem. The current episode started more than 1 year ago. The problem has been resolved since onset. She has tried laxatives for the symptoms. The treatment provided moderate relief.      Review of Systems  Gastrointestinal:  Positive for constipation and heartburn.  Psychiatric/Behavioral:  The patient is nervous/anxious.   All other systems reviewed and are negative.   Family History  Problem Relation Age of Onset   Arthritis Mother    Depression Mother    Hearing loss Mother    Miscarriages / [place] Mother    Alcohol abuse Father    Cancer Father        prostate   Diabetes Father    Heart disease Father    Asthma Maternal Aunt    Asthma Maternal Uncle    Uterine cancer Paternal Grandmother    Stroke  Paternal Grandfather    Colon cancer Maternal Uncle    Social History   Socioeconomic History   Marital status: Married    Spouse name: Not on file   Number of children: Not on file   Years of education: Not on file   Highest education level: Not on file  Occupational History   Not on file  Tobacco Use   Smoking status: Never   Smokeless tobacco: Never  Substance and Sexual Activity   Alcohol use: No   Drug use: No   Sexual activity: Not on file  Other Topics Concern   Not on file  Social History Narrative   Not on file   Social Determinants of Health   Financial Resource Strain: Not on file  Food Insecurity: No Food Insecurity (07/22/2022)   Hunger Vital Sign    Worried About Running Out of Food in the Last Year: Never true    Ran Out of Food in the Last Year: Never true  Transportation Needs: No Transportation Needs (07/22/2022)   PRAPARE - Administrator, Civil Service (Medical): No    Lack of Transportation (Non-Medical): No  Physical Activity: Not on file  Stress: No Stress Concern Present (11/08/2018)   Received from [facility], [facility] of Occupational Health - Occupational Stress Questionnaire    Feeling of Stress : Not at all  Social Connections: Unknown (05/19/2021)   Received from [facility], [facility]  Social Network    Social Network: Not on file       Objective:   Physical Exam Vitals reviewed.  Constitutional:      General: She is not in acute distress.    Appearance: She is well-developed.  HENT:     Head: Normocephalic and atraumatic.     Right Ear: Tympanic membrane normal. There is no impacted cerumen.     Left Ear: Tympanic membrane normal.  Eyes:     Pupils: Pupils are equal, round, and reactive to light.  Neck:     Thyroid: No thyromegaly.  Cardiovascular:     Rate and Rhythm: Normal rate and regular rhythm.     Heart sounds: Normal heart sounds. No murmur heard. Pulmonary:      Effort: Pulmonary effort is normal. No respiratory distress.     Breath sounds: Normal breath sounds. No wheezing.  Abdominal:     General: Bowel sounds are normal. There is no distension.     Palpations: Abdomen is soft.     Tenderness: There is no abdominal tenderness.  Musculoskeletal:        General: No tenderness. Normal range of motion.     Cervical back: Normal range of motion and neck supple.  Skin:    General: Skin is warm and dry.  Neurological:     Mental Status: She is alert and oriented to person, place, and time.     Cranial Nerves: No cranial nerve deficit.     Deep Tendon Reflexes: Reflexes are normal and symmetric.  Psychiatric:        Behavior: Behavior normal.        Thought Content: Thought content normal.        Judgment: Judgment normal.      BP 135/84   Pulse 70   Temp 97.8 F (36.6 C) (Temporal)   Ht 5\' 6"  (1.676 m)   Wt 151 lb (68.5 kg)   SpO2 99%   BMI 24.37 kg/m      Assessment & Plan:  ARRIANNA CATALA comes in today with chief complaint of Medical Management of Chronic Issues   Diagnosis and orders addressed:  1. Annual physical exam  2. Obesity (BMI 30-39.9)  3. Migraine without aura and without status migrainosus, not intractable  4. Gastroesophageal reflux disease with esophagitis, unspecified whether hemorrhage Will decrease Nexium to 20 mg from 40 mg  -Diet discussed- Avoid fried, spicy, citrus foods, caffeine and alcohol -Do not eat 2-3 hours before bedtime -Encouraged small frequent meals -Avoid NSAID's - esomeprazole (NEXIUM) 20 MG capsule; Take 1 capsule (20 mg total) by mouth daily at 12 noon.  Dispense: 90 capsule; Refill: 1  5. GAD (generalized anxiety disorder)  6. Constipation, unspecified constipation type   Pt had labs drawn at work last month, will scan into chart. Wants to hold off on labs today. Health Maintenance reviewed Diet and exercise encouraged  Follow up plan: 1 year    Jannifer Rodney, FNP

## 2022-10-19 NOTE — Patient Instructions (Signed)

## 2023-01-19 ENCOUNTER — Other Ambulatory Visit: Payer: Self-pay | Admitting: Family

## 2023-01-21 ENCOUNTER — Ambulatory Visit (INDEPENDENT_AMBULATORY_CARE_PROVIDER_SITE_OTHER): Payer: BC Managed Care – PPO | Admitting: Family Medicine

## 2023-01-21 ENCOUNTER — Encounter: Payer: Self-pay | Admitting: Family Medicine

## 2023-01-21 VITALS — BP 115/78 | HR 78 | Temp 98.5°F | Ht 66.0 in | Wt 150.0 lb

## 2023-01-21 DIAGNOSIS — F411 Generalized anxiety disorder: Secondary | ICD-10-CM | POA: Diagnosis not present

## 2023-01-21 DIAGNOSIS — F418 Other specified anxiety disorders: Secondary | ICD-10-CM | POA: Diagnosis not present

## 2023-01-21 NOTE — Progress Notes (Signed)
Subjective:  Patient ID: Amy Stout, female    DOB: 1965/08/29, 58 y.o.   MRN: [id number]  Patient Care Team: Junie Spencer, FNP as PCP - General (Family Medicine) Lanelle Bal, DO as Consulting Physician (Internal Medicine) Center, [facility] (Obstetrics and Gynecology)   Chief Complaint:  Anxiety (Needing note to get out of Jury Duty)   HPI: Amy Stout is a 58 y.o. female presenting on 01/21/2023 for Anxiety (Needing note to get out of Jury Duty)  Anxiety  States that she has anxiety and she can usually "control it". States that she is good when she stays in her comfort zone. States that she cannot complete jury duty summons and it is giving her increased symptoms of anxiety since she received summons. Describes that she panics, has excessive worry, cannot sleep due to anxiety. She reports that she is not able to handle situations that are out of her comfort zones. She expresses concern that she would have a panic attack at court. Denies thoughts of self harm.   Relevant past medical, surgical, family, and social history reviewed and updated as indicated.  Allergies and medications reviewed and updated. Data reviewed: Chart in Epic.   Past Medical History:  Diagnosis Date   Anxiety    GERD (gastroesophageal reflux disease)    Migraines     Past Surgical History:  Procedure Laterality Date   CHOLECYSTECTOMY N/A 07/17/2022   Procedure: LAPAROSCOPIC CHOLECYSTECTOMY;  Surgeon: Harriette Bouillon, MD;  Location: [facility] OR;  Service: General;  Laterality: N/A;   COLONOSCOPY WITH PROPOFOL N/A 04/18/2020   Procedure: COLONOSCOPY WITH PROPOFOL;  Surgeon: Corbin Ade, MD;  Location: [facility] ENDO SUITE;  Service: Endoscopy;  Laterality: N/A;  AM   ERCP N/A 07/19/2022   Procedure: ENDOSCOPIC RETROGRADE CHOLANGIOPANCREATOGRAPHY (ERCP);  Surgeon: Lynann Bologna, MD;  Location: [facility] ENDOSCOPY;  Service: Gastroenterology;  Laterality: N/A;   INTRAOPERATIVE CHOLANGIOGRAM N/A 07/17/2022    Procedure: INTRAOPERATIVE CHOLANGIOGRAM;  Surgeon: Harriette Bouillon, MD;  Location: [facility] OR;  Service: General;  Laterality: N/A;   none     POLYPECTOMY  04/18/2020   Procedure: POLYPECTOMY;  Surgeon: Corbin Ade, MD;  Location: [facility] ENDO SUITE;  Service: Endoscopy;;   REMOVAL OF STONES  07/19/2022   Procedure: REMOVAL OF STONES;  Surgeon: Lynann Bologna, MD;  Location[facility] ENDOSCOPY;  Service: Gastroenterology;;   Dennison Mascot  07/19/2022   Procedure: Dennison Mascot;  Surgeon: Lynann Bologna, MD;  Location: [facility] ENDOSCOPY;  Service: Gastroenterology;;    Social History   Socioeconomic History   Marital status: Married    Spouse name: Not on file   Number of children: Not on file   Years of education: Not on file   Highest education level: Not on file  Occupational History   Not on file  Tobacco Use   Smoking status: Never   Smokeless tobacco: Never  Substance and Sexual Activity   Alcohol use: No   Drug use: No   Sexual activity: Not on file  Other Topics Concern   Not on file  Social History Narrative   Not on file   Social Drivers of Health   Financial Resource Strain: Not on file  Food Insecurity: No Food Insecurity (07/22/2022)   Hunger Vital Sign    Worried About Running Out of Food in the Last Year: Never true    Ran Out of Food in the Last Year: Never true  Transportation Needs: No Transportation Needs (07/22/2022)   PRAPARE - Transportation  Lack of Transportation (Medical): No    Lack of Transportation (Non-Medical): No  Physical Activity: Not on file  Stress: No Stress Concern Present (11/08/2018)   Received from [facility], [facility]   [facility] of Occupational Health - Occupational Stress Questionnaire    Feeling of Stress : Not at all  Social Connections: Unknown (05/19/2021)   Received from [facility], [facility]   Social Network    Social Network: Not on file  Intimate Partner Violence: Not At Risk (07/16/2022)   Humiliation, Afraid,  Rape, and Kick questionnaire    Fear of Current or Ex-Partner: No    Emotionally Abused: No    Physically Abused: No    Sexually Abused: No    Outpatient Encounter Medications as of 01/21/2023  Medication Sig   esomeprazole (NEXIUM) 20 MG capsule Take 1 capsule (20 mg total) by mouth daily at 12 noon.   polyethylene glycol (MIRALAX / GLYCOLAX) 17 g packet Take 17 g by mouth daily as needed.   SUMAtriptan (IMITREX) 50 MG tablet TAKE 1 TABLET BY MOUTH EVERY 2 HOURS AS NEEDED FOR MIGRAINE. MAY REPEAT IN TWO HOURS IF HEADACHE PERSISTS OR RECURS   No facility-administered encounter medications on file as of 01/21/2023.    No Known Allergies  Review of Systems As per HPI  Objective:  BP 115/78   Pulse 78   Temp 98.5 F (36.9 C)   Ht 5\' 6"  (1.676 m)   Wt 150 lb (68 kg)   SpO2 100%   BMI 24.21 kg/m    Wt Readings from Last 3 Encounters:  01/21/23 150 lb (68 kg)  10/19/22 151 lb (68.5 kg)  07/17/22 168 lb (76.2 kg)    Physical Exam Constitutional:      General: She is awake. She is not in acute distress.    Appearance: Normal appearance. She is well-developed and well-groomed. She is not ill-appearing, toxic-appearing or diaphoretic.  Cardiovascular:     Rate and Rhythm: Normal rate and regular rhythm.     Pulses: Normal pulses.          Radial pulses are 2+ on the right side and 2+ on the left side.       Posterior tibial pulses are 2+ on the right side and 2+ on the left side.     Heart sounds: Normal heart sounds. No murmur heard.    No gallop.  Pulmonary:     Effort: Pulmonary effort is normal. No respiratory distress.     Breath sounds: Normal breath sounds. No stridor. No wheezing, rhonchi or rales.  Musculoskeletal:     Cervical back: Full passive range of motion without pain and neck supple.     Right lower leg: No edema.     Left lower leg: No edema.  Skin:    General: Skin is warm.     Capillary Refill: Capillary refill takes less than 2 seconds.   Neurological:     General: No focal deficit present.     Mental Status: She is alert, oriented to person, place, and time and easily aroused. Mental status is at baseline.     GCS: GCS eye subscore is 4. GCS verbal subscore is 5. GCS motor subscore is 6.     Motor: No weakness.  Psychiatric:        Attention and Perception: Attention and perception normal.        Mood and Affect: Mood is anxious. Affect is tearful.  Speech: Speech normal.        Behavior: Behavior normal. Behavior is cooperative.        Thought Content: Thought content normal. Thought content does not include homicidal or suicidal ideation. Thought content does not include homicidal or suicidal plan.        Cognition and Memory: Cognition and memory normal.        Judgment: Judgment normal.     Comments: Patient very tearful during office visit      Results for orders placed or performed in visit on 09/04/22  HM MAMMOGRAPHY   Collection Time: 09/01/22 12:00 AM  Result Value Ref Range   HM Mammogram 0-4 Bi-Rad 0-4 Bi-Rad, Self Reported Normal       01/21/2023   11:14 AM 03/30/2022   11:03 AM 10/30/2021   11:45 AM 11/21/2019    4:40 PM 08/02/2018    2:11 PM  Depression screen PHQ 2/9  Decreased Interest 1 0 0 1 0  Down, Depressed, Hopeless 0 0 0 0 0  PHQ - 2 Score 1 0 0 1 0  Altered sleeping 1 1 0 1   Tired, decreased energy 1 0 0 0   Change in appetite 0 0 0 0   Feeling bad or failure about yourself  0 0 0 0   Trouble concentrating 0 0 0 1   Moving slowly or fidgety/restless 0 0 0 0   Suicidal thoughts 0 0 0 0   PHQ-9 Score 3 1 0 3   Difficult doing work/chores  Not difficult at all Not difficult at all Not difficult at all        01/21/2023   11:14 AM 03/30/2022   11:03 AM 10/30/2021   11:45 AM 11/21/2019    4:40 PM  GAD 7 : Generalized Anxiety Score  Nervous, Anxious, on Edge 3 1 1 1   Control/stop worrying 3 1 0 1  Worry too much - different things 2 0 0 1  Trouble relaxing 1 0 0 1   Restless 0 0 0 0  Easily annoyed or irritable 1 0 0 1  Afraid - awful might happen 0 0 0 0  Total GAD 7 Score 10 2 1 5   Anxiety Difficulty Somewhat difficult Not difficult at all Not difficult at all       Pertinent labs & imaging results that were available during my care of the patient were reviewed by me and considered in my medical decision making.  Assessment & Plan:  Amy Stout "Larita Fife" was seen today for anxiety.  Diagnoses and all orders for this visit:  GAD (generalized anxiety disorder) Provided note for patient to be excused from jury duty. Encouraged patient to follow up with PCP if she would like to start treatment or counseling for anxiety. Denies SI. Safety contract established.   Situational anxiety As above.   Continue all other maintenance medications.  Follow up plan: Return if symptoms worsen or fail to improve.   Continue healthy lifestyle choices, including diet (rich in fruits, vegetables, and lean proteins, and low in salt and simple carbohydrates) and exercise (at least 30 minutes of moderate physical activity daily).  Written and verbal instructions provided   The above assessment and management plan was discussed with the patient. The patient verbalized understanding of and has agreed to the management plan. Patient is aware to call the clinic if they develop any new symptoms or if symptoms persist or worsen. Patient is aware when to return to the clinic  for a follow-up visit. Patient educated on when it is appropriate to go to the emergency department.   Neale Burly, DNP-FNP [facility] Medicine [address] [place], [place] [address] [phone]

## 2023-02-09 DIAGNOSIS — M1712 Unilateral primary osteoarthritis, left knee: Secondary | ICD-10-CM | POA: Diagnosis not present

## 2023-02-09 DIAGNOSIS — R55 Syncope and collapse: Secondary | ICD-10-CM | POA: Diagnosis not present

## 2023-02-11 ENCOUNTER — Ambulatory Visit: Payer: BC Managed Care – PPO | Admitting: Nurse Practitioner

## 2023-02-11 ENCOUNTER — Encounter: Payer: Self-pay | Admitting: Nurse Practitioner

## 2023-02-11 VITALS — BP 121/81 | HR 84 | Temp 97.1°F | Ht 66.0 in | Wt 149.6 lb

## 2023-02-11 DIAGNOSIS — J0110 Acute frontal sinusitis, unspecified: Secondary | ICD-10-CM

## 2023-02-11 DIAGNOSIS — R051 Acute cough: Secondary | ICD-10-CM | POA: Diagnosis not present

## 2023-02-11 MED ORDER — AZITHROMYCIN 250 MG PO TABS: ORAL_TABLET | ORAL | 0 refills | Status: DC

## 2023-02-11 MED ORDER — BENZONATATE 100 MG PO CAPS
100.0000 mg | ORAL_CAPSULE | Freq: Three times a day (TID) | ORAL | 0 refills | Status: DC | PRN
Start: 2023-02-11 — End: 2023-04-06

## 2023-02-11 NOTE — Progress Notes (Signed)
 Acute Office Visit  Subjective:     Patient ID: Amy Stout, female    DOB: 06/29/1965, 58 y.o.   MRN: [id number]  Chief Complaint  Patient presents with   Nasal Congestion    Symptoms started over weekend. At home covid test came back negative   Cough   Headache   Amy Stout is a 58 y.o. female who complains of HA, congestion, nasal blockage, post nasal drip, productive cough, and cough described as nocturnal and productive of white sputum for 5 days. She denies a history of anorexia, chest pain, myalgias, nausea, and vomiting and denies a history of asthma. Patient denies smoke cigarettes.  Cough Associated symptoms include headaches. Pertinent negatives include no chest pain, chills, fever, myalgias or sore throat.  Headache  Associated symptoms include coughing. Pertinent negatives include no eye pain, fever, nausea, sore throat or vomiting.      Active Ambulatory Problems    Diagnosis Date Noted   Pain of both shoulder joints 12/04/2015   Migraine without aura and without status migrainosus, not intractable 12/04/2015   GERD (gastroesophageal reflux disease) 08/02/2018   Obesity (BMI 30-39.9) 08/02/2018   GAD (generalized anxiety disorder) 08/02/2018   Positive colorectal cancer screening using Cologuard test 03/08/2020   Cholecystitis with cholelithiasis 07/16/2022   Elevated liver enzymes 07/19/2022   Biliary colic 07/19/2022   Constipation 10/19/2022   Resolved Ambulatory Problems    Diagnosis Date Noted   Viral upper respiratory tract infection 05/27/2020   Past Medical History:  Diagnosis Date   Anxiety    Migraines     Review of Systems  Constitutional:  Negative for chills and fever.  HENT:  Positive for congestion. Negative for ear discharge and sore throat.   Eyes:  Negative for pain.  Respiratory:  Positive for cough.   Cardiovascular:  Negative for chest pain and leg swelling.  Gastrointestinal:  Negative for constipation, diarrhea, nausea  and vomiting.  Musculoskeletal:  Negative for myalgias.  Neurological:  Positive for headaches.   Negative unless indicated in HPI    Objective:    BP 121/81   Pulse 84   Temp (!) 97.1 F (36.2 C) (Temporal)   Ht 5' 6 (1.676 m)   Wt 149 lb 9.6 oz (67.9 kg)   SpO2 96%   BMI 24.15 kg/m  BP Readings from Last 3 Encounters:  02/11/23 121/81  01/21/23 115/78  10/19/22 135/84   Wt Readings from Last 3 Encounters:  02/11/23 149 lb 9.6 oz (67.9 kg)  01/21/23 150 lb (68 kg)  10/19/22 151 lb (68.5 kg)      Physical Exam Vitals and nursing note reviewed.  Constitutional:      General: She is not in acute distress.    Appearance: She is well-developed.  HENT:     Head: Normocephalic.     Right Ear: Tympanic membrane, ear canal and external ear normal. There is no impacted cerumen.     Left Ear: Tympanic membrane, ear canal and external ear normal. There is no impacted cerumen.     Nose: Mucosal edema, congestion and rhinorrhea present.     Right Nostril: No epistaxis.     Mouth/Throat:     Lips: Pink.     Mouth: Mucous membranes are moist.  Eyes:     General: No scleral icterus.    Extraocular Movements: Extraocular movements intact.     Pupils: Pupils are equal, round, and reactive to light.  Cardiovascular:     Rate  and Rhythm: Normal rate and regular rhythm.  Pulmonary:     Effort: Pulmonary effort is normal.     Breath sounds: Normal breath sounds. No wheezing.  Abdominal:     General: Bowel sounds are normal.     Palpations: Abdomen is soft.  Musculoskeletal:        General: Normal range of motion.     Right lower leg: No edema.     Left lower leg: Edema present.  Skin:    General: Skin is warm and dry.     Findings: No rash.  Neurological:     Mental Status: She is alert and oriented to person, place, and time.  Psychiatric:        Mood and Affect: Mood normal.        Behavior: Behavior normal.        Thought Content: Thought content normal.         Judgment: Judgment normal.     No results found for any visits on 02/11/23.      Assessment & Plan:  Acute non-recurrent frontal sinusitis -     Azithromycin ; 2-tabs on day one and 1-tab daily until done  Dispense: 6 each; Refill: 0  Acute cough -     Benzonatate ; Take 1 capsule (100 mg total) by mouth 3 (three) times daily as needed.  Dispense: 20 capsule; Refill: 0   Care was this 58 year old Caucasian female seen today for sinusitis, no acute distress Plan to treat with Zithromax  No. 6 dispense client instructed to take 2 pills on the first day and 1 pill until done; Tessalon  Perles 100 mg 1 tablet 3 times daily as needed as needed for cough take each pill with 8 ounces of water  Increase hydration  rest, use vaporizer or mist prn, apply heat to sinuses prn, and return office visit prn if symptoms persist or worsen. Lack of antibiotic effectiveness discussed with her. Call or return to clinic prn if these symptoms worsen or fail to improve as anticipated.     The above assessment and management plan was discussed with the patient. The patient verbalized understanding of and has agreed to the management plan. Patient is aware to call the clinic if they develop any new symptoms or if symptoms persist or worsen. Patient is aware when to return to the clinic for a follow-up visit. Patient educated on when it is appropriate to go to the emergency department.  Return if symptoms worsen or fail to improve.  Amy Michl [place] Thompson, DNP [facility] [address] [place], [place] [address] [phone]  Note: This document was prepared by [employer] voice dictation technology and any errors that results from this process are unintentional.

## 2023-03-13 ENCOUNTER — Other Ambulatory Visit: Payer: Self-pay | Admitting: Family

## 2023-04-06 ENCOUNTER — Ambulatory Visit (INDEPENDENT_AMBULATORY_CARE_PROVIDER_SITE_OTHER): Admitting: Family

## 2023-04-06 ENCOUNTER — Encounter: Payer: Self-pay | Admitting: Family

## 2023-04-06 VITALS — BP 133/83 | HR 64 | Temp 97.8°F | Ht 66.0 in | Wt 154.4 lb

## 2023-04-06 DIAGNOSIS — B079 Viral wart, unspecified: Secondary | ICD-10-CM | POA: Diagnosis not present

## 2023-04-06 NOTE — Progress Notes (Signed)
 Subjective:    Patient ID: Amy Stout, female    DOB: March 05, 1965, 58 y.o.   MRN: [id number]  Chief Complaint  Patient presents with   Plantar Warts    RT FOOT OVER A MTH     HPI PT presents to the office today with wart on right lateral foot that she noticed over a month ago. She has tried "digging it out" without relief.    Review of Systems  All other systems reviewed and are negative.   Social History   Socioeconomic History   Marital status: Married    Spouse name: Not on file   Number of children: Not on file   Years of education: Not on file   Highest education level: 12th grade  Occupational History   Not on file  Tobacco Use   Smoking status: Never   Smokeless tobacco: Never  Substance and Sexual Activity   Alcohol use: No   Drug use: No   Sexual activity: Not on file  Other Topics Concern   Not on file  Social History Narrative   Not on file   Social Drivers of Health   Financial Resource Strain: Low Risk  (04/05/2023)   Overall Financial Resource Strain (CARDIA)    Difficulty of Paying Living Expenses: Not hard at all  Food Insecurity: Food Insecurity Present (04/05/2023)   Hunger Vital Sign    Worried About Running Out of Food in the Last Year: Sometimes true    Ran Out of Food in the Last Year: Never true  Transportation Needs: No Transportation Needs (04/05/2023)   PRAPARE - Administrator, Civil Service (Medical): No    Lack of Transportation (Non-Medical): No  Physical Activity: Insufficiently Active (04/05/2023)   Exercise Vital Sign    Days of Exercise per Week: 3 days    Minutes of Exercise per Session: 10 min  Stress: Stress Concern Present (04/05/2023)   [employer] of Occupational Health - Occupational Stress Questionnaire    Feeling of Stress : To some extent  Social Connections: Socially Integrated (04/05/2023)   Social Connection and Isolation Panel [NHANES]    Frequency of Communication with Friends and Family:  More than three times a week    Frequency of Social Gatherings with Friends and Family: Twice a week    Attends Religious Services: 1 to 4 times per year    Active Member of [employer] or Organizations: Yes    Attends Engineer, structural: 1 to 4 times per year    Marital Status: Married   Family History  Problem Relation Age of Onset   Arthritis Mother    Depression Mother    Hearing loss Mother    Miscarriages / [place] Mother    Alcohol abuse Father    Cancer Father        prostate   Diabetes Father    Heart disease Father    Asthma Maternal Aunt    Asthma Maternal Uncle    Uterine cancer Paternal Grandmother    Stroke Paternal Grandfather    Colon cancer Maternal Uncle         Objective:   Physical Exam Vitals reviewed.  Constitutional:      General: She is not in acute distress.    Appearance: She is well-developed.  HENT:     Head: Normocephalic and atraumatic.  Eyes:     Pupils: Pupils are equal, round, and reactive to light.  Neck:     Thyroid:  No thyromegaly.  Cardiovascular:     Rate and Rhythm: Normal rate and regular rhythm.     Heart sounds: Normal heart sounds. No murmur heard. Pulmonary:     Effort: Pulmonary effort is normal. No respiratory distress.     Breath sounds: Normal breath sounds. No wheezing.  Abdominal:     General: Bowel sounds are normal. There is no distension.     Palpations: Abdomen is soft.     Tenderness: There is no abdominal tenderness.  Musculoskeletal:        General: No tenderness. Normal range of motion.     Cervical back: Normal range of motion and neck supple.       Feet:  Feet:     Comments: Wart right lateral foot Skin:    General: Skin is warm and dry.  Neurological:     Mental Status: She is alert and oriented to person, place, and time.     Cranial Nerves: No cranial nerve deficit.     Deep Tendon Reflexes: Reflexes are normal and symmetric.  Psychiatric:        Behavior: Behavior normal.         Thought Content: Thought content normal.        Judgment: Judgment normal.    Cryotherapy used on right foot, pt tolerated well.    BP 133/83   Pulse 64   Temp 97.8 F (36.6 C) (Temporal)   Ht 5\' 6"  (1.676 m)   Wt 154 lb 6.4 oz (70 kg)   SpO2 98%   BMI 24.92 kg/m      Assessment & Plan:  Amy Stout comes in today with chief complaint of Plantar Warts (RT FOOT OVER A MTH )   Diagnosis and orders addressed:  1. Viral warts, unspecified type (Primary) Cryotherapy used  Can use compound W and duct tape Follow up if symptoms worsen or do not improve      Jannifer Rodney, FNP

## 2023-04-06 NOTE — Patient Instructions (Signed)
Warts  Warts are small growths on the skin. They are common and can occur on many areas of the body. A person may have one wart or several warts. In many cases, warts do not need treatment. They usually go away on their own over a period of many months to a few years. If needed, warts that cause problems or do not go away on their own can be treated. What are the causes? Warts are caused by a type of virus called human papillomavirus (HPV). HPV can spread from person to person through direct contact. Warts can also spread to other areas of the body when a person scratches a wart and then scratches another area of his or her body. What increases the risk? You are more likely to develop this condition if: You are 10-20 years old. You have a weakened body defense system (immune system). You are Caucasian. What are the signs or symptoms? The main symptom of this condition is small growths on the skin. Warts may: Be round or oval or have an irregular shape. Have a rough surface. Range in color from skin color to light yellow, brown, or gray. Generally be less than  inch (1.3 cm) in size. Go away and then come back again. Most warts are painless, but some can be painful if they are large or occur in an area of the body where pressure is applied to them, such as the bottom of the foot. How is this diagnosed? A wart can usually be diagnosed based on its appearance. In some cases, a tissue sample may be removed (biopsy) to be looked at under a microscope. How is this treated? In many cases, warts do not need treatment. Sometimes treatment is wanted. If treatment is needed or wanted, options may include: Applying medicated solutions, creams, or patches to the wart. These may be over-the-counter or prescription medicines that make the skin soft so that layers will gradually shed away. In many cases, the medicine is applied one or two times per day and covered with a bandage. Putting duct tape over  the top of the wart (occlusion). You will leave the tape in place for as long as told by your health care provider and then replace it with a new strip of tape. This is done until the wart goes away. Freezing the wart with liquid nitrogen (cryotherapy). Burning the wart with: Laser treatment. An electrified probe (electrocautery). Injection of a medicine into the wart to help the body's immune system fight off the wart. Surgery to remove the wart. Follow these instructions at home: Medicines Use over-the-counter and prescription medicines only as told by your health care provider. Do not use over-the-counter wart medicines on your face or genitals unless your health care provider tells you to do that. Lifestyle Keep your immune system healthy. To do this: Eat a healthy, balanced diet. Get enough sleep. Do not use any products that contain nicotine or tobacco. These products include cigarettes, chewing tobacco, and vaping devices, such as e-cigarettes. If you need help quitting, ask your health care provider. General instructions  Wash your hands after you touch a wart. Do not scratch or pick at a wart. Avoid shaving hair that is over a wart. Keep all follow-up visits. This is important. Contact a health care provider if: Your warts do not improve after treatment. You have redness, swelling, or pain at the site of a wart. You have bleeding from a wart that does not stop with light pressure. You have   diabetes and you develop a wart. Summary Warts are small growths on the skin. They are common and can occur on many areas of the body. In many cases, warts do not need treatment. Sometimes treatment is wanted. If treatment is needed or wanted, there are several treatment options. Apply over-the-counter and prescription medicines only as told by your health care provider. Wash your hands after you touch a wart. Keep all follow-up visits. This is important. This information is not intended  to replace advice given to you by your health care provider. Make sure you discuss any questions you have with your health care provider. Document Revised: 01/24/2021 Document Reviewed: 01/24/2021 Elsevier Patient Education  2024 [employer].

## 2023-04-17 ENCOUNTER — Other Ambulatory Visit: Payer: Self-pay | Admitting: Family

## 2023-04-17 DIAGNOSIS — K2100 Gastro-esophageal reflux disease with esophagitis, without bleeding: Secondary | ICD-10-CM

## 2023-07-20 ENCOUNTER — Other Ambulatory Visit: Payer: Self-pay | Admitting: *Deleted

## 2023-07-20 MED ORDER — SUMATRIPTAN SUCCINATE 50 MG PO TABS: ORAL_TABLET | ORAL | 1 refills | Status: DC

## 2023-09-09 ENCOUNTER — Ambulatory Visit: Payer: Self-pay

## 2023-09-09 NOTE — Telephone Encounter (Signed)
 Appt made.

## 2023-09-09 NOTE — Telephone Encounter (Signed)
 No in office availability, pt scheduled for virtual UC. This RN recommended she take a home covid test prior to appt if she has one available. ED precautions reviewed, pt verbalized understanding.  FYI Only or Action Required?: FYI only for provider.  Patient was last seen in primary care on 04/06/2023 by Lavell Bari LABOR, FNP.  Called Nurse Triage reporting Sinusitis.  Symptoms began yesterday.  Interventions attempted: OTC medications: tylenol  severe sinus.  Symptoms are: gradually worsening.  Triage Disposition: See Physician Within 24 Hours  Patient/caregiver understands and will follow disposition?: Yes Reason for Disposition  [1] Sinus pain (not just congestion) AND [2] fever  Answer Assessment - Initial Assessment Questions Tried tylenol  severe sinus, but had no improvement.  1. LOCATION: Where does it hurt?      Head congestion  2. ONSET: When did the sinus pain start?  (e.g., hours, days)      Last night  3. SEVERITY: How bad is the pain?   (Scale 0-10; or none, mild, moderate or severe)     Described as a head pressure/mild headache with intermittent sharp pain to her teeth  4. RECURRENT SYMPTOM: Have you ever had sinus problems before? If Yes, ask: When was the last time? and What happened that time?      Sinusitis approximately once per year  5. NASAL CONGESTION:      Yes  Protocols used: Sinus Pain or Congestion-A-AH Copied from CRM #[id number]. Topic: Clinical - Medication Question >> Sep 09, 2023  1:44 PM Antwanette L wrote: Reason for CRM: Patient is requesting medication for a sinus infection. Patient is experiencing a cough and head congestion. The patient can be contacted at  [phone]

## 2023-09-10 ENCOUNTER — Telehealth: Admitting: Physician Assistant

## 2023-09-10 DIAGNOSIS — B9689 Other specified bacterial agents as the cause of diseases classified elsewhere: Secondary | ICD-10-CM | POA: Diagnosis not present

## 2023-09-10 DIAGNOSIS — J019 Acute sinusitis, unspecified: Secondary | ICD-10-CM | POA: Diagnosis not present

## 2023-09-10 MED ORDER — AMOXICILLIN-POT CLAVULANATE 875-125 MG PO TABS
1.0000 | ORAL_TABLET | Freq: Two times a day (BID) | ORAL | 0 refills | Status: DC
Start: 2023-09-10 — End: 2023-09-14

## 2023-09-10 NOTE — Patient Instructions (Signed)
 Amy Stout, thank you for joining [name], PA-C for today's virtual visit.  While this provider is not your primary care provider (PCP), if your PCP is located in our provider database this encounter information will be shared with them immediately following your visit.   A [facility] MyChart account gives you access to today's visit and all your visits, tests, and labs performed at [facility]  click here if you don't have a [facility] MyChart account or go to [email or web address]  Consent: (Patient) Amy Stout provided verbal consent for this virtual visit at the beginning of the encounter.  Current Medications:  Current Outpatient Medications:    amoxicillin -clavulanate (AUGMENTIN ) 875-125 MG tablet, Take 1 tablet by mouth 2 (two) times daily., Disp: 14 tablet, Rfl: 0   aspirin-acetaminophen -caffeine (EXCEDRIN MIGRAINE) 250-250-65 MG tablet, Take by mouth every 6 (six) hours as needed for headache., Disp: , Rfl:    esomeprazole  (NEXIUM ) 20 MG capsule, TAKE 1 CAPSULE BY MOUTH ONCE DAILY AT 12 NOON, Disp: 90 capsule, Rfl: 1   SUMAtriptan  (IMITREX ) 50 MG tablet, TAKE 1 TABLET BY MOUTH EVERY 2 HOURS AS NEEDED FOR MIGRAINE. MAY REPEAT IN TWO HOURS IF HEADACHE PERSISTS OR RECURS, Disp: 20 tablet, Rfl: 1   Medications ordered in this encounter:  Meds ordered this encounter  Medications   amoxicillin -clavulanate (AUGMENTIN ) 875-125 MG tablet    Sig: Take 1 tablet by mouth 2 (two) times daily.    Dispense:  14 tablet    Refill:  0    Supervising Provider:   BLAISE ALEENE KIDD [[id number]]     *If you need refills on other medications prior to your next appointment, please contact your pharmacy*  Follow-Up: Call back or seek an in-person evaluation if the symptoms worsen or if the condition fails to improve as anticipated.  [facility] [phone]  Other Instructions Sinus Infection, Adult A sinus infection, also called sinusitis, is  inflammation of your sinuses. Sinuses are hollow spaces in the bones around your face. Your sinuses are located: Around your eyes. In the middle of your forehead. Behind your nose. In your cheekbones. Mucus normally drains out of your sinuses. When your nasal tissues become inflamed or swollen, mucus can become trapped or blocked. This allows bacteria, viruses, and fungi to grow, which leads to infection. Most infections of the sinuses are caused by a virus. A sinus infection can develop quickly. It can last for up to 4 weeks (acute) or for more than 12 weeks (chronic). A sinus infection often develops after a cold. What are the causes? This condition is caused by anything that creates swelling in the sinuses or stops mucus from draining. This includes: Allergies. Asthma. Infection from bacteria or viruses. Deformities or blockages in your nose or sinuses. Abnormal growths in the nose (nasal polyps). Pollutants, such as chemicals or irritants in the air. Infection from fungi. This is rare. What increases the risk? You are more likely to develop this condition if you: Have a weak body defense system (immune system). Do a lot of swimming or diving. Overuse nasal sprays. Smoke. What are the signs or symptoms? The main symptoms of this condition are pain and a feeling of pressure around the affected sinuses. Other symptoms include: Stuffy nose or congestion that makes it difficult to breathe through your nose. Thick yellow or greenish drainage from your nose. Tenderness, swelling, and warmth over the affected sinuses. A cough that may get worse at night. Decreased sense of  smell and taste. Extra mucus that collects in the throat or the back of the nose (postnasal drip) causing a sore throat or bad breath. Tiredness (fatigue). Fever. How is this diagnosed? This condition is diagnosed based on: Your symptoms. Your medical history. A physical exam. Tests to find out if your condition is  acute or chronic. This may include: Checking your nose for nasal polyps. Viewing your sinuses using a device that has a light (endoscope). Testing for allergies or bacteria. Imaging tests, such as an MRI or CT scan. In rare cases, a bone biopsy may be done to rule out more serious types of fungal sinus disease. How is this treated? Treatment for a sinus infection depends on the cause and whether your condition is chronic or acute. If caused by a virus, your symptoms should go away on their own within 10 days. You may be given medicines to relieve symptoms. They include: Medicines that shrink swollen nasal passages (decongestants). A spray that eases inflammation of the nostrils (topical intranasal corticosteroids). Rinses that help get rid of thick mucus in your nose (nasal saline washes). Medicines that treat allergies (antihistamines). Over-the-counter pain relievers. If caused by bacteria, your health care provider may recommend waiting to see if your symptoms improve. Most bacterial infections will get better without antibiotic medicine. You may be given antibiotics if you have: A severe infection. A weak immune system. If caused by narrow nasal passages or nasal polyps, surgery may be needed. Follow these instructions at home: Medicines Take, use, or apply over-the-counter and prescription medicines only as told by your health care provider. These may include nasal sprays. If you were prescribed an antibiotic medicine, take it as told by your health care provider. Do not stop taking the antibiotic even if you start to feel better. Hydrate and humidify  Drink enough fluid to keep your urine pale yellow. Staying hydrated will help to thin your mucus. Use a cool mist humidifier to keep the humidity level in your home above 50%. Inhale steam for 10-15 minutes, 3-4 times a day, or as told by your health care provider. You can do this in the bathroom while a hot shower is running. Limit  your exposure to cool or dry air. Rest Rest as much as possible. Sleep with your head raised (elevated). Make sure you get enough sleep each night. General instructions  Apply a warm, moist washcloth to your face 3-4 times a day or as told by your health care provider. This will help with discomfort. Use nasal saline washes as often as told by your health care provider. Wash your hands often with soap and water  to reduce your exposure to germs. If soap and water  are not available, use hand sanitizer. Do not smoke. Avoid being around people who are smoking (secondhand smoke). Keep all follow-up visits. This is important. Contact a health care provider if: You have a fever. Your symptoms get worse. Your symptoms do not improve within 10 days. Get help right away if: You have a severe headache. You have persistent vomiting. You have severe pain or swelling around your face or eyes. You have vision problems. You develop confusion. Your neck is stiff. You have trouble breathing. These symptoms may be an emergency. Get help right away. Call 911. Do not wait to see if the symptoms will go away. Do not drive yourself to the hospital. Summary A sinus infection is soreness and inflammation of your sinuses. Sinuses are hollow spaces in the bones around your face.  This condition is caused by nasal tissues that become inflamed or swollen. The swelling traps or blocks the flow of mucus. This allows bacteria, viruses, and fungi to grow, which leads to infection. If you were prescribed an antibiotic medicine, take it as told by your health care provider. Do not stop taking the antibiotic even if you start to feel better. Keep all follow-up visits. This is important. This information is not intended to replace advice given to you by your health care provider. Make sure you discuss any questions you have with your health care provider. Document Revised: 11/26/2020 Document Reviewed:  11/26/2020 Elsevier Patient Education  2024 Elsevier Inc.   If you have been instructed to have an in-person evaluation today at a local Urgent Care facility, please use the link below. It will take you to a list of all of our available [facility], including address, phone number and hours of operation. Please do not delay care.  [facility]  If you or a family member do not have a primary care provider, use the link below to schedule a visit and establish care. When you choose a [facility] primary care physician or advanced practice provider, you gain a long-term partner in health. Find a Primary Care Provider  Learn more about [facility]'s in-office and virtual care options:  - Get Care Now

## 2023-09-10 NOTE — Progress Notes (Signed)
 Virtual Visit Consent   Amy Stout, you are scheduled for a virtual visit with a [facility] provider today. Just as with appointments in the office, your consent must be obtained to participate. Your consent will be active for this visit and any virtual visit you may have with one of our providers in the next 365 days. If you have a MyChart account, a copy of this consent can be sent to you electronically.  As this is a virtual visit, video technology does not allow for your provider to perform a traditional examination. This may limit your provider's ability to fully assess your condition. If your provider identifies any concerns that need to be evaluated in person or the need to arrange testing (such as labs, EKG, etc.), we will make arrangements to do so. Although advances in technology are sophisticated, we cannot ensure that it will always work on either your end or our end. If the connection with a video visit is poor, the visit may have to be switched to a telephone visit. With either a video or telephone visit, we are not always able to ensure that we have a secure connection.  By engaging in this virtual visit, you consent to the provision of healthcare and authorize for your insurance to be billed (if applicable) for the services provided during this visit. Depending on your insurance coverage, you may receive a charge related to this service.  I need to obtain your verbal consent now. Are you willing to proceed with your visit today? MARKISHA MEDING has provided verbal consent on 09/10/2023 for a virtual visit (video or telephone). [name], PA-C  Date: 09/10/2023 7:55 AM   Virtual Visit via Video Note   I, [name], connected with  Amy Stout  ([id number], 28-Aug-1965) on 09/10/23 at  7:45 AM EDT by a video-enabled telemedicine application and verified that I am speaking with the correct person using two identifiers.  Location: Patient: Virtual Visit Location  Patient: Home Provider: Virtual Visit Location Provider: Home Office   I discussed the limitations of evaluation and management by telemedicine and the availability of in person appointments. The patient expressed understanding and agreed to proceed.    History of Present Illness: Amy Stout is a 58 y.o. who identifies as a female who was assigned female at birth, and is being seen today for sinus congestion.  HPI: Sinusitis This is a new problem. The current episode started in the past 7 days. The problem has been gradually improving since onset. There has been no fever. The pain is moderate. Associated symptoms include chills, congestion, coughing (mild), headaches, sinus pressure and a sore throat (scratchy but improved). Pertinent negatives include no diaphoresis, ear pain, hoarse voice or shortness of breath. Treatments tried: tylenol  sinus, theraflu. The treatment provided mild relief.   At home Covid test was negative   Problems:  Patient Active Problem List   Diagnosis Date Noted   Constipation 10/19/2022   Elevated liver enzymes 07/19/2022   Biliary colic 07/19/2022   Cholecystitis with cholelithiasis 07/16/2022   Positive colorectal cancer screening using Cologuard test 03/08/2020   GERD (gastroesophageal reflux disease) 08/02/2018   Obesity (BMI 30-39.9) 08/02/2018   GAD (generalized anxiety disorder) 08/02/2018   Pain of both shoulder joints 12/04/2015   Migraine without aura and without status migrainosus, not intractable 12/04/2015    Allergies:  Allergies  Allergen Reactions   Flonase  [Fluticasone ]     Hyper then felt bad and did  not like the feeling   Medications:  Current Outpatient Medications:    amoxicillin -clavulanate (AUGMENTIN ) 875-125 MG tablet, Take 1 tablet by mouth 2 (two) times daily., Disp: 14 tablet, Rfl: 0   aspirin-acetaminophen -caffeine (EXCEDRIN MIGRAINE) 250-250-65 MG tablet, Take by mouth every 6 (six) hours as needed for headache., Disp: ,  Rfl:    esomeprazole  (NEXIUM ) 20 MG capsule, TAKE 1 CAPSULE BY MOUTH ONCE DAILY AT 12 NOON, Disp: 90 capsule, Rfl: 1   SUMAtriptan  (IMITREX ) 50 MG tablet, TAKE 1 TABLET BY MOUTH EVERY 2 HOURS AS NEEDED FOR MIGRAINE. MAY REPEAT IN TWO HOURS IF HEADACHE PERSISTS OR RECURS, Disp: 20 tablet, Rfl: 1  Observations/Objective: Patient is well-developed, well-nourished in no acute distress.  Resting comfortably at home.  Head is normocephalic, atraumatic.  No labored breathing. Speech is clear and coherent with logical content.  Patient is alert and oriented at baseline.    Assessment and Plan: 1. Acute bacterial sinusitis (Primary) - amoxicillin -clavulanate (AUGMENTIN ) 875-125 MG tablet; Take 1 tablet by mouth 2 (two) times daily.  Dispense: 14 tablet; Refill: 0  - Worsening symptoms that have not responded to OTC medications.  - Will give Augmentin  - Continue allergy medications.  - Steam and humidifier can help - Stay well hydrated and get plenty of rest.  - Seek in person evaluation if no symptom improvement or if symptoms worsen   Follow Up Instructions: I discussed the assessment and treatment plan with the patient. The patient was provided an opportunity to ask questions and all were answered. The patient agreed with the plan and demonstrated an understanding of the instructions.  A copy of instructions were sent to the patient via MyChart unless otherwise noted below.    The patient was advised to call back or seek an in-person evaluation if the symptoms worsen or if the condition fails to improve as anticipated.    [name], PA-C

## 2023-09-13 ENCOUNTER — Ambulatory Visit: Admitting: Nurse Practitioner

## 2023-09-14 ENCOUNTER — Ambulatory Visit: Admitting: Family

## 2023-09-14 ENCOUNTER — Encounter: Payer: Self-pay | Admitting: Family

## 2023-09-14 VITALS — BP 117/79 | HR 74 | Temp 97.9°F | Ht 66.0 in | Wt 149.8 lb

## 2023-09-14 DIAGNOSIS — K2100 Gastro-esophageal reflux disease with esophagitis, without bleeding: Secondary | ICD-10-CM | POA: Diagnosis not present

## 2023-09-14 DIAGNOSIS — M79676 Pain in unspecified toe(s): Secondary | ICD-10-CM | POA: Diagnosis not present

## 2023-09-14 MED ORDER — PANTOPRAZOLE SODIUM 20 MG PO TBEC: 20.0000 mg | DELAYED_RELEASE_TABLET | Freq: Every day | ORAL | 1 refills | Status: DC

## 2023-09-14 NOTE — Progress Notes (Signed)
 Subjective:    Patient ID: Amy Stout, female    DOB: September 14, 1965, 58 y.o.   MRN: [id number]  Chief Complaint  Patient presents with   Heartburn   PT presents to the office today with GERD. Reports she is having epigastric pain after eating and then being active. She is taking Nexium  20 mg without relief.   She hit her left fifth toe yesterday on a rocking chair. States she had to push it back into place and now has swelling, bruising. Her pain is a 0 if she is not walking and a 3 when walking. She has taped the toe.  Heartburn She complains of abdominal pain, belching and heartburn. She reports no choking, no coughing, no dysphagia, no early satiety, no globus sensation or no nausea. This is a chronic problem. The current episode started more than 1 year ago. The problem occurs frequently. The problem has been gradually worsening. The symptoms are aggravated by certain foods.      Review of Systems  Respiratory:  Negative for cough and choking.   Gastrointestinal:  Positive for abdominal pain and heartburn. Negative for dysphagia and nausea.  All other systems reviewed and are negative.   Social History   Socioeconomic History   Marital status: Married    Spouse name: Not on file   Number of children: Not on file   Years of education: Not on file   Highest education level: GED or equivalent  Occupational History   Not on file  Tobacco Use   Smoking status: Never   Smokeless tobacco: Never  Substance and Sexual Activity   Alcohol use: No   Drug use: No   Sexual activity: Not on file  Other Topics Concern   Not on file  Social History Narrative   Not on file   Social Drivers of Health   Financial Resource Strain: Low Risk  (09/13/2023)   Overall Financial Resource Strain (CARDIA)    Difficulty of Paying Living Expenses: Not hard at all  Food Insecurity: No Food Insecurity (09/13/2023)   Hunger Vital Sign    Worried About Running Out of Food in the Last Year:  Never true    Ran Out of Food in the Last Year: Never true  Transportation Needs: No Transportation Needs (09/13/2023)   PRAPARE - Administrator, Civil Service (Medical): No    Lack of Transportation (Non-Medical): No  Physical Activity: Insufficiently Active (09/13/2023)   Exercise Vital Sign    Days of Exercise per Week: 2 days    Minutes of Exercise per Session: 30 min  Stress: Stress Concern Present (09/13/2023)   [employer] of Occupational Health - Occupational Stress Questionnaire    Feeling of Stress: To some extent  Social Connections: Moderately Integrated (09/13/2023)   Social Connection and Isolation Panel    Frequency of Communication with Friends and Family: Once a week    Frequency of Social Gatherings with Friends and Family: Once a week    Attends Religious Services: 1 to 4 times per year    Active Member of [employer] or Organizations: Yes    Attends Banker Meetings: 1 to 4 times per year    Marital Status: Married   Family History  Problem Relation Age of Onset   Arthritis Mother    Depression Mother    Hearing loss Mother    Miscarriages / [place] Mother    Alcohol abuse Father    Cancer Father  prostate   Diabetes Father    Heart disease Father    Asthma Maternal Aunt    Asthma Maternal Uncle    Uterine cancer Paternal Grandmother    Stroke Paternal Grandfather    Colon cancer Maternal Uncle         Objective:   Physical Exam Vitals reviewed.  Constitutional:      General: She is not in acute distress.    Appearance: She is well-developed.  HENT:     Head: Normocephalic and atraumatic.     Right Ear: Tympanic membrane normal.     Left Ear: Tympanic membrane normal.  Eyes:     Pupils: Pupils are equal, round, and reactive to light.  Neck:     Thyroid : No thyromegaly.  Cardiovascular:     Rate and Rhythm: Normal rate and regular rhythm.     Heart sounds: Normal heart sounds. No murmur heard. Pulmonary:      Effort: Pulmonary effort is normal. No respiratory distress.     Breath sounds: Normal breath sounds. No wheezing.  Abdominal:     General: Bowel sounds are normal. There is no distension.     Palpations: Abdomen is soft.     Tenderness: There is no abdominal tenderness.  Musculoskeletal:        General: Tenderness present. Normal range of motion.     Cervical back: Normal range of motion and neck supple.       Feet:  Feet:     Comments: Bruising, swelling, and tenderness Skin:    General: Skin is warm and dry.  Neurological:     Mental Status: She is alert and oriented to person, place, and time.     Cranial Nerves: No cranial nerve deficit.     Deep Tendon Reflexes: Reflexes are normal and symmetric.  Psychiatric:        Behavior: Behavior normal.        Thought Content: Thought content normal.        Judgment: Judgment normal.       BP 117/79   Pulse 74   Temp 97.9 F (36.6 C)   Ht 5' 6 (1.676 m)   Wt 149 lb 12.8 oz (67.9 kg)   SpO2 100%   BMI 24.18 kg/m      Assessment & Plan:  [name] comes in today with chief complaint of Heartburn   Diagnosis and orders addressed:  1. Gastroesophageal reflux disease with esophagitis, unspecified whether hemorrhage (Primary) Stop Nexium  and start Protonix  20 mg  -Diet discussed- Avoid fried, spicy, citrus foods, caffeine and alcohol -Do not eat 2-3 hours before bedtime -Encouraged small frequent meals -Avoid NSAID's -Follow up in 1 month for CPE - pantoprazole  (PROTONIX ) 20 MG tablet; Take 1 tablet (20 mg total) by mouth daily.  Dispense: 90 tablet; Refill: 1  2. Pain of fifth toe Buddy tape  Ice Avoid injury Probable fracture, discussed we treat the same.    Return in about 1 month (around 10/14/2023), or if symptoms worsen or fail to improve.    Bari Learn, FNP

## 2023-09-14 NOTE — Patient Instructions (Addendum)
 Hiatal Hernia  A hiatal hernia occurs when part of the stomach slides above the muscle that separates the abdomen from the chest (diaphragm). A person can be born with a hiatal hernia (congenital), or it may develop over time. In almost all cases of hiatal hernia, only the top part of the stomach pushes through the diaphragm. Many people have a hiatal hernia with no symptoms. The larger the hernia, the more likely it is that you will have symptoms. In some cases, a hiatal hernia allows stomach acid to flow back into the tube that carries food from your mouth to your stomach (esophagus). This may cause heartburn symptoms. The development of heartburn symptoms may mean that you have a condition called gastroesophageal reflux disease (GERD). What are the causes? This condition is caused by a weakness in the opening (hiatus) where the esophagus passes through the diaphragm to attach to the upper part of the stomach. A person may be born with a weakness in the hiatus, or a weakness can develop over time. What increases the risk? This condition is more likely to develop in: Older people. Age is a major risk factor for a hiatal hernia, especially if you are over the age of 22. Pregnant women. People who are overweight. People who have frequent constipation. What are the signs or symptoms? Symptoms of this condition usually develop in the form of GERD symptoms. Symptoms include: Heartburn. Upset stomach (indigestion). Trouble swallowing. Coughing or wheezing. Wheezing is making high-pitched whistling sounds when you breathe. Sore throat. Chest pain. Nausea and vomiting. How is this diagnosed? This condition may be diagnosed during testing for GERD. Tests that may be done include: X-rays of your stomach or chest. An upper gastrointestinal (GI) series. This is an X-ray exam of your GI tract that is taken after you swallow a chalky liquid that shows up clearly on the X-ray. Endoscopy. This is a  procedure to look into your stomach using a thin, flexible tube that has a tiny camera and light on the end of it. How is this treated? This condition may be treated by: Dietary and lifestyle changes to help reduce GERD symptoms. Medicines. These may include: Over-the-counter antacids. Medicines that make your stomach empty more quickly. Medicines that block the production of stomach acid (H2 blockers). Stronger medicines to reduce stomach acid (proton pump inhibitors). Surgery to repair the hernia, if other treatments are not helping. If you have no symptoms, you may not need treatment. Follow these instructions at home: Lifestyle and activity Do not use any products that contain nicotine or tobacco. These products include cigarettes, chewing tobacco, and vaping devices, such as e-cigarettes. If you need help quitting, ask your health care provider. Try to achieve and maintain a healthy body weight. Avoid putting pressure on your abdomen. Anything that puts pressure on your abdomen increases the amount of acid that may be pushed up into your esophagus. Avoid bending over, especially after eating. Raise the head of your bed by putting blocks under the legs. This keeps your head and esophagus higher than your stomach. Do not wear tight clothing around your chest or stomach. Try not to strain when having a bowel movement, when urinating, or when lifting heavy objects. Eating and drinking Avoid foods that can worsen GERD symptoms. These may include: Fatty foods, like fried foods. Citrus fruits, like oranges or lemon. Other foods and drinks that contain acid, like orange juice or tomatoes. Spicy food. Chocolate. Eat frequent small meals instead of three large meals a  day. This helps prevent your stomach from getting too full. Eat slowly. Do not lie down right after eating. Do not eat 1-2 hours before bed. Do not drink beverages with caffeine. These include cola, coffee, cocoa, and tea. Do  not drink alcohol. General instructions Take over-the-counter and prescription medicines only as told by your health care provider. Keep all follow-up visits. Your health care provider will want to check that any new prescribed medicines are helping your symptoms. Contact a health care provider if: Your symptoms are not controlled with medicines or lifestyle changes. You are having trouble swallowing. You have coughing or wheezing that will not go away. Your pain is getting worse. Your pain spreads to your arms, neck, jaw, teeth, or back. You feel nauseous or you vomit. Get help right away if: You have shortness of breath. You vomit blood. You have bright red blood in your stools. You have black, tarry stools. These symptoms may be an emergency. Get help right away. Call 911. Do not wait to see if the symptoms will go away. Do not drive yourself to the hospital. Summary A hiatal hernia occurs when part of the stomach slides above the muscle that separates the abdomen from the chest. A person may be born with a weakness in the hiatus, or a weakness can develop over time. Symptoms of a hiatal hernia may include heartburn, trouble swallowing, or sore throat. Management of a hiatal hernia includes eating frequent small meals instead of three large meals a day. Get help right away if you vomit blood, have bright red blood in your stools, or have black, tarry stools. This information is not intended to replace advice given to you by your health care provider. Make sure you discuss any questions you have with your health care provider.   GERD in Adults: What to Know  Gastroesophageal reflux (GER) is when acid from your stomach flows up into your esophagus. Your esophagus is the part of your body that moves food from your mouth to your stomach. Normally, food goes down and stays in your stomach to be digested. But with GER, food and stomach acid may go back up. You may have a disease called  gastroesophageal reflux disease (GERD) if the reflux: Happens often. Causes very bad symptoms. Makes your esophagus sore and swollen. Over time, GERD can make small holes called ulcers in the lining of your esophagus. What are the causes? GERD is caused by a problem with the muscle between your esophagus and stomach. This muscle is called the lower esophageal sphincter (LES). When it's weak or not normal, it doesn't close like it should. This means food and stomach acid can go back up into your esophagus. The muscle can be weak if: You smoke or use products with tobacco in them. You're pregnant. You have a type of hernia called a hiatal hernia. You eat certain foods and drinks. These include: Alcohol. Coffee. Chocolate. Onions. Peppermint. What increases the risk? Being overweight. Having a disease that affects your connective tissue. Taking NSAIDs, such as ibuprofen. What are the signs or symptoms? Heartburn. Trouble swallowing. Pain when you swallow. The feeling of having a lump in your throat. A bitter taste in your mouth. Bad breath. Having an upset or bloated stomach. Burping. Chest pain. Other conditions can also cause chest pain. Make sure you see your health care provider if you have chest pain. Wheezing. This is when you make high-pitched whistling sounds when you breathe, most often when you breathe out. A  long-term cough or a cough at night. How is this diagnosed? GERD may be diagnosed based on your medical history and a physical exam. You may also have tests. These may include: An endoscopy. This test looks at your stomach and esophagus with a small camera. A barium swallow test. This shows the shape and size of your esophagus and how well it's working. Tests of your esophagus to check for: Acid levels. Pressure. How is this treated? Treatment may depend on how bad your symptoms are. It may include: Changes to your diet and daily  life. Medicines. Surgery. Follow these instructions at home: Eating and drinking Follow an eating plan as told by your provider. You may need to avoid certain foods and drinks. These may include: Coffee and tea, with or without caffeine. Alcohol. Energy drinks and sports drinks. Fizzy drinks or sodas. Chocolate and cocoa. Peppermint and mint flavorings. Garlic and onions. Horseradish. Spicy and acidic foods. These include: Peppers. Chili powder and curry powder. Vinegar. Hot sauces and BBQ sauce. Citrus fruits and juices. These include: Oranges. Lemons. Limes. Tomato-based foods. These include: Red sauce and pizza with red sauce. Chili. Salsa. Fried and fatty foods. These include: Donuts. [place] fries. Potato chips. High-fat dressings. High-fat meats. These include: Hot dogs and sausage. Rib eye steak. Ham and bacon. High-fat dairy items. These include: Whole milk. Butter. Cream cheese. Eat small meals often. Avoid eating big meals. Avoid drinking lots of liquid with your meals. Try not to eat meals during the 2-3 hours before bedtime. Try not to lie down right after you eat. Do not exercise right after you eat. Lifestyle  If you're overweight, lose an amount of weight that's healthy for you. Ask your provider about a safe weight loss goal. Do not smoke, vape, or use nicotine or tobacco. Wear loose clothes. Do not wear things that are tight around your waist. When you sleep, try: Raising the head of your bed about 6 inches (15 cm). You can use a wedge to do this. Lying down on your left side. Try to lower your stress. If you need help doing this, ask your provider. General instructions Take your medicines only as told. Do not take aspirin or ibuprofen unless you're told to. Watch for any changes in your symptoms. Do not bend over if it makes your symptoms worse. Contact a health care provider if: You have new symptoms. You have  trouble: Drinking. Swallowing. Eating. It hurts to swallow. You have wheezing. You have a cough that won't go away. Your voice is hoarse. Your symptoms don't get better with treatment. Get help right away if: You have pain all of a sudden in your: Arm. Neck. Jaw. Teeth. Back. You feel sweaty, dizzy, or light-headed all of a sudden. You faint. You have chest pain or shortness of breath. You vomit and the vomit is: Green, yellow, or black. Looks like blood or coffee grounds. Your poop is red, bloody, or black. These symptoms may be an emergency. Call 911 right away. Do not wait to see if the symptoms will go away. Do not drive yourself to the hospital. This information is not intended to replace advice given to you by your health care provider. Make sure you discuss any questions you have with your health care provider. Document Revised: 11/03/2022 Document Reviewed: 05/20/2022 Elsevier Patient Education  2024 Elsevier Inc. Document Revised: 02/18/2021 Document Reviewed: 02/18/2021 Elsevier Patient Education  2024 [employer].

## 2023-09-15 ENCOUNTER — Encounter: Payer: Self-pay | Admitting: Family

## 2023-10-07 ENCOUNTER — Ambulatory Visit: Admitting: Family

## 2023-11-01 ENCOUNTER — Ambulatory Visit: Admitting: Family

## 2023-11-01 ENCOUNTER — Encounter: Admitting: Family

## 2024-01-21 LAB — LAB REPORT - SCANNED: A1c: 5.4

## 2024-01-28 ENCOUNTER — Ambulatory Visit: Admitting: Family

## 2024-01-28 ENCOUNTER — Encounter: Payer: Self-pay | Admitting: Family

## 2024-01-28 VITALS — BP 126/81 | HR 75 | Temp 98.4°F | Ht 66.0 in | Wt 133.8 lb

## 2024-01-28 DIAGNOSIS — G43009 Migraine without aura, not intractable, without status migrainosus: Secondary | ICD-10-CM | POA: Diagnosis not present

## 2024-01-28 DIAGNOSIS — Z0001 Encounter for general adult medical examination with abnormal findings: Secondary | ICD-10-CM

## 2024-01-28 DIAGNOSIS — F411 Generalized anxiety disorder: Secondary | ICD-10-CM | POA: Diagnosis not present

## 2024-01-28 DIAGNOSIS — E049 Nontoxic goiter, unspecified: Secondary | ICD-10-CM | POA: Diagnosis not present

## 2024-01-28 DIAGNOSIS — K2100 Gastro-esophageal reflux disease with esophagitis, without bleeding: Secondary | ICD-10-CM | POA: Diagnosis not present

## 2024-01-28 DIAGNOSIS — K5900 Constipation, unspecified: Secondary | ICD-10-CM

## 2024-01-28 DIAGNOSIS — R634 Abnormal weight loss: Secondary | ICD-10-CM

## 2024-01-28 DIAGNOSIS — Z0000 Encounter for general adult medical examination without abnormal findings: Secondary | ICD-10-CM

## 2024-01-28 MED ORDER — SUMATRIPTAN SUCCINATE 50 MG PO TABS: ORAL_TABLET | ORAL | 1 refills | Status: AC

## 2024-01-28 MED ORDER — PANTOPRAZOLE SODIUM 40 MG PO TBEC: 40.0000 mg | DELAYED_RELEASE_TABLET | Freq: Every day | ORAL | 1 refills | Status: AC

## 2024-01-28 NOTE — Progress Notes (Signed)
 "  Subjective:    Patient ID: Amy Stout, female    DOB: 06/19/1965, 59 y.o.   MRN: [id number]  Chief Complaint  Patient presents with   Annual Exam    Acid reflux med not working    PT presents to the office today for CPE without pap.    Had cholecystectomy 07/24. Her GERD worsening. Has lost 21 lbs since 04/06/23.      01/28/2024   10:09 AM 09/14/2023   10:24 AM 04/06/2023   12:10 PM  Last 3 Weights  Weight (lbs) 133 lb 12.8 oz 149 lb 12.8 oz 154 lb 6.4 oz  Weight (kg) 60.691 kg 67.949 kg 70.035 kg      Lab work completed 01/20/24 and has brought a copy. Will scan into chart.  Migraine  This is a chronic problem. The current episode started more than 1 year ago. The problem occurs seasonly (5). The problem has been gradually improving. The pain is located in the Right unilateral region. The pain quality is similar to prior headaches. The pain is moderate. Associated symptoms include phonophobia and photophobia. Pertinent negatives include no nausea. She has tried Excedrin and triptans for the symptoms. The treatment provided moderate relief. Her past medical history is significant for migraine headaches.  Gastroesophageal Reflux She complains of belching and heartburn. She reports no nausea. This is a chronic problem. The current episode started more than 1 year ago. The problem occurs constantly. The symptoms are aggravated by certain foods. She has tried a PPI for the symptoms. The treatment provided moderate relief.  Anxiety Presents for follow-up visit. Symptoms include excessive worry, nervous/anxious behavior and restlessness. Patient reports no nausea. The severity of symptoms is mild.    Constipation This is a chronic problem. The current episode started more than 1 year ago. The problem has been resolved since onset. Her stool frequency is 2 to 3 times per week. Pertinent negatives include no nausea. She has tried laxatives for the symptoms. The treatment provided moderate  relief.      Review of Systems  Eyes:  Positive for photophobia.  Gastrointestinal:  Positive for constipation and heartburn. Negative for nausea.  Psychiatric/Behavioral:  The patient is nervous/anxious.   All other systems reviewed and are negative.   Family History  Problem Relation Age of Onset   Arthritis Mother    Depression Mother    Hearing loss Mother    Miscarriages / Stillbirths Mother    Alcohol abuse Father    Cancer Father        prostate   Diabetes Father    Heart disease Father    Asthma Maternal Aunt    Asthma Maternal Uncle    Uterine cancer Paternal Grandmother    Stroke Paternal Grandfather    Colon cancer Maternal Uncle    Social History   Socioeconomic History   Marital status: Married    Spouse name: Not on file   Number of children: Not on file   Years of education: Not on file   Highest education level: 12th grade  Occupational History   Not on file  Tobacco Use   Smoking status: Never   Smokeless tobacco: Never  Substance and Sexual Activity   Alcohol use: No   Drug use: No   Sexual activity: Not on file  Other Topics Concern   Not on file  Social History Narrative   Not on file   Social Drivers of Health   Tobacco Use: Low Risk (01/28/2024)  Patient History    Smoking Tobacco Use: Never    Smokeless Tobacco Use: Never    Passive Exposure: Not on file  Financial Resource Strain: Low Risk (10/29/2023)   Overall Financial Resource Strain (CARDIA)    Difficulty of Paying Living Expenses: Not hard at all  Food Insecurity: No Food Insecurity (10/29/2023)   Epic    Worried About Programme Researcher, Broadcasting/film/video in the Last Year: Never true    Ran Out of Food in the Last Year: Never true  Transportation Needs: No Transportation Needs (10/29/2023)   Epic    Lack of Transportation (Medical): No    Lack of Transportation (Non-Medical): No  Physical Activity: Insufficiently Active (10/29/2023)   Exercise Vital Sign    Days of Exercise per Week:  2 days    Minutes of Exercise per Session: 30 min  Stress: Stress Concern Present (10/29/2023)   [employer] of Occupational Health - Occupational Stress Questionnaire    Feeling of Stress: To some extent  Social Connections: Moderately Integrated (10/29/2023)   Social Connection and Isolation Panel    Frequency of Communication with Friends and Family: Once a week    Frequency of Social Gatherings with Friends and Family: Once a week    Attends Religious Services: 1 to 4 times per year    Active Member of Clubs or Organizations: Yes    Attends Banker Meetings: 1 to 4 times per year    Marital Status: Married  Depression (PHQ2-9): Low Risk (01/28/2024)   Depression (PHQ2-9)    PHQ-2 Score: 3  Alcohol Screen: Low Risk (09/13/2023)   Alcohol Screen    Last Alcohol Screening Score (AUDIT): 2  Housing: Unknown (10/29/2023)   Epic    Unable to Pay for Housing in the Last Year: No    Number of Times Moved in the Last Year: Not on file    Homeless in the Last Year: No  Utilities: Not At Risk (07/16/2022)   [facility] Utilities    Threatened with loss of utilities: No  Health Literacy: Not on file       Objective:   Physical Exam Vitals reviewed.  Constitutional:      General: She is not in acute distress.    Appearance: She is well-developed.  HENT:     Head: Normocephalic and atraumatic.     Right Ear: Tympanic membrane normal. There is no impacted cerumen.     Left Ear: Tympanic membrane normal.  Eyes:     Pupils: Pupils are equal, round, and reactive to light.  Neck:     Thyroid : Thyromegaly present.  Cardiovascular:     Rate and Rhythm: Normal rate and regular rhythm.     Heart sounds: Normal heart sounds. No murmur heard. Pulmonary:     Effort: Pulmonary effort is normal. No respiratory distress.     Breath sounds: Normal breath sounds. No wheezing.  Abdominal:     General: Bowel sounds are normal. There is no distension.     Palpations: Abdomen is soft.      Tenderness: There is no abdominal tenderness.  Musculoskeletal:        General: No tenderness. Normal range of motion.     Cervical back: Normal range of motion and neck supple.  Skin:    General: Skin is warm and dry.  Neurological:     Mental Status: She is alert and oriented to person, place, and time.     Cranial Nerves: No cranial nerve deficit.  Deep Tendon Reflexes: Reflexes are normal and symmetric.  Psychiatric:        Behavior: Behavior normal.        Thought Content: Thought content normal.        Judgment: Judgment normal.      BP 126/81   Pulse 75   Temp 98.4 F (36.9 C) (Temporal)   Ht 5' 6 (1.676 m)   Wt 133 lb 12.8 oz (60.7 kg)   BMI 21.60 kg/m      Assessment & Plan:  Amy Stout comes in today with chief complaint of Annual Exam (Acid reflux med not working )   Diagnosis and orders addressed:  1. Annual physical exam (Primary) Labs reviewed from work, will scan into chart  2. Goiter - US  THYROID ; Future  3. Constipation, unspecified constipation type - Ambulatory referral to Gastroenterology  4. GAD (generalized anxiety disorder) Stress management   5. Gastroesophageal reflux disease with esophagitis, unspecified whether hemorrhage Will increase Protonix  to 40 mg from 20 mg for 1 week. If GERD continues can increase to BID -Diet discussed- Avoid fried, spicy, citrus foods, caffeine and alcohol -Do not eat 2-3 hours before bedtime -Encouraged small frequent meals -Avoid NSAID's -Referral to GI pending - pantoprazole  (PROTONIX ) 40 MG tablet; Take 1 tablet (40 mg total) by mouth daily.  Dispense: 90 tablet; Refill: 1 - Ambulatory referral to Gastroenterology  6. Migraine without aura and without status migrainosus, not intractable - SUMAtriptan  (IMITREX ) 50 MG tablet; TAKE 1 TABLET BY MOUTH EVERY 2 HOURS AS NEEDED FOR MIGRAINE. MAY REPEAT IN TWO HOURS IF HEADACHE PERSISTS OR RECURS  Dispense: 20 tablet; Refill: 1  7. Unintentional  weight loss - Ambulatory referral to Gastroenterology   Pt had labs drawn at work 01/21/24, will scan into chart. Wants to hold off on labs today. Will increase Protonix  to 40 mg from 20 mg for 1 week. If GERD continues can increase to BID Referral to GI pending, has lost 21 lbs.  US  thyroid  pending  Health Maintenance reviewed Diet and exercise encouraged  Follow up plan: 3 months on GERD, weight loss, and US  thyroid    Bari Learn, FNP   "

## 2024-01-28 NOTE — Patient Instructions (Signed)
 GERD in Adults: Diet Changes When you have gastroesophageal reflux disease (GERD), you may need to make changes to your diet. Choosing the right foods can help with your symptoms. Think about working with an expert in healthy eating called a dietitian. They can help you make healthy food choices. What are tips for following this plan? Reading food labels Look for foods that are low in saturated fat. Foods that may help with your symptoms include: Foods with less than 5% of daily value (DV) of fat. Foods with 0 grams of trans fat. Cooking [employer] in ways that don't use a lot of fat. These ways include: Baking. Steaming. Grilling. Broiling. To add flavor, try to use herbs that are low in spice and acidity. Avoid frying your food. Meal planning  Eat small meals often rather than eating 3 large meals each day. Eat your meals slowly in a place where you feel relaxed. If told by your health care provider, avoid: Foods that cause symptoms. Keep a food diary to keep track of foods that cause symptoms. Alcohol. Drinking a lot of liquid with meals. General instructions For 2-3 hours after you eat, avoid: Bending over. Exercise. Lying down. Chew sugar-free gum after meals. What foods should I eat? Eat a healthy diet. Try to include: Foods with high amounts of fiber. These include: Fruits and vegetables. Whole grains and beans. Low-fat dairy products. Lean meats, fish, and poultry. Egg whites. Foods that cause symptoms in someone else may not cause symptoms for you. Work with your provider to find foods that are safe for you. The items listed above may not be all the foods and drinks you can have. Talk with a dietitian to learn more. The items listed above may not be a complete list of foods and beverages you can eat and drink. Contact a dietitian for more information. What foods should I avoid? Limiting some of these foods may help with your symptoms. Each person is different.  Talk with a dietitian or your provider to help you find the exact foods to avoid. Some of the foods to avoid may include: Fruits Fruits with a lot of acid in them. These may include citrus fruits, such as oranges, grapefruit, pineapple, and lemons. Vegetables Deep-fried vegetables, such as [place] fries. Vegetables, sauces, or toppings made with added fat and vegetables with acid in them. These may include tomatoes and tomato products, chili peppers, onions, garlic, and horseradish. Grains Pastries or quick breads with added fat. Meats and other proteins High-fat meats, such as fatty beef or pork, hot dogs, ribs, ham, sausage, salami, and bacon. Fried meat or protein, such as fried fish and fried chicken. Egg yolks. Fats and oils Butter. Margarine. Shortening. Ghee. Drinks Coffee and other drinks with caffeine in them. Fizzy and sugary drinks, such as soda and energy drinks. Fruit juice made with acidic fruits, such as orange or grapefruit. Tomato juice. Sweets and desserts Chocolate and cocoa. Donuts. Seasonings and condiments Mint, such as peppermint and spearmint. Condiments, herbs, or seasonings that cause symptoms. These may include curry, hot sauce, or vinegar-based salad dressings. The items listed above may not be all the foods and drinks you should avoid. Talk with a dietitian to learn more. Questions to ask your health care provider Changes to your diet and everyday life are often the first steps taken to manage symptoms of GERD. If these changes don't help, talk with your provider about taking medicines. Where to find more information [employer] G[employer]intestinal [employer]:  aboutgerd.org This information is not intended to replace advice given to you by your health care provider. Make sure you discuss any questions you have with your health care provider. Document Revised: 11/03/2022 Document Reviewed: 05/20/2022 Elsevier Patient Education  2024 [employer].

## 2024-02-03 ENCOUNTER — Other Ambulatory Visit (HOSPITAL_COMMUNITY)

## 2024-02-28 ENCOUNTER — Ambulatory Visit: Admitting: Gastroenterology

## 2024-05-02 ENCOUNTER — Ambulatory Visit: Admitting: Family
